# Patient Record
Sex: Male | Born: 1970 | Race: White | Hispanic: No | Marital: Single | Smoking: Former smoker
Health system: Southern US, Community
[De-identification: ages and names within clinical notes are randomized; demographics above are authoritative.]

## PROBLEM LIST (undated history)

## (undated) DIAGNOSIS — I2510 Atherosclerotic heart disease of native coronary artery without angina pectoris: Secondary | ICD-10-CM

## (undated) DIAGNOSIS — N200 Calculus of kidney: Secondary | ICD-10-CM

## (undated) DIAGNOSIS — E119 Type 2 diabetes mellitus without complications: Secondary | ICD-10-CM

## (undated) DIAGNOSIS — E785 Hyperlipidemia, unspecified: Secondary | ICD-10-CM

## (undated) DIAGNOSIS — J40 Bronchitis, not specified as acute or chronic: Secondary | ICD-10-CM

## (undated) DIAGNOSIS — I10 Essential (primary) hypertension: Secondary | ICD-10-CM

## (undated) HISTORY — DX: Hyperlipidemia, unspecified: E78.5

## (undated) HISTORY — DX: Calculus of kidney: N20.0

## (undated) HISTORY — DX: Type 2 diabetes mellitus without complications: E11.9

## (undated) HISTORY — DX: Atherosclerotic heart disease of native coronary artery without angina pectoris: I25.10

## (undated) HISTORY — DX: Hemochromatosis, unspecified: E83.119

---

## 2004-05-21 ENCOUNTER — Emergency Department: Payer: Self-pay | Admitting: General Practice

## 2004-05-23 ENCOUNTER — Ambulatory Visit: Payer: Self-pay | Admitting: General Practice

## 2004-05-23 IMAGING — NM NM MYOCARD GATED
6 series · 36 of 36 positions shown · non-contrast
Comparison: none

REASON FOR EXAM: Chest pain
COMMENTS:

PROCEDURE:     NM  - NM  GATED MYOVIEW   [DATE]  [DATE]
RESULT:
PROTOCOL: The patient underwent exercise treadmill utilizing Bruce protocol
achieving 85% of the maximum predicted heart rate.  The patient received
12.89 millicuries of Myoview with stress and 31.14 millicuries during delay
imaging.
RESULTS:  Gated imaging revealed an LV ejection fraction of 63%.  SPECT
analysis did not reveal any fixed or reversible defects.

[Series 1: gated stress myoview-gated (recon) · 6.6mm · 6.59mm/px · 6 of 264 frames shown]
[frame 23/264]
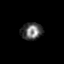
[frame 67/264]
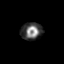
[frame 111/264]
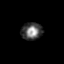
[frame 155/264]
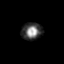
[frame 199/264]
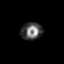
[frame 243/264]
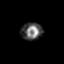

[Series 1: gated stress myoview-gated (corrected) · 6.59mm/px · 6 of 512 frames shown]
[frame 43/512  full-range]
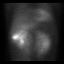
[frame 128/512  full-range]
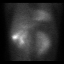
[frame 214/512  full-range]
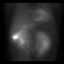
[frame 299/512  full-range]
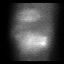
[frame 384/512  full-range]
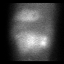
[frame 470/512  full-range]
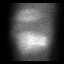

[Series 1: rest myoview (recon) · 6.6mm · 6.59mm/px · 6 of 36 frames shown]
[frame 4/36]
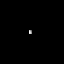
[frame 10/36]
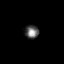
[frame 16/36]
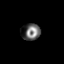
[frame 22/36]
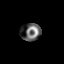
[frame 28/36]
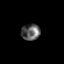
[frame 34/36]
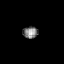

[Series 1: gated stress myoview · 6.59mm/px · 6 of 64 frames shown]
[frame 6/64]
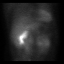
[frame 16/64]
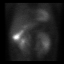
[frame 27/64]
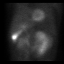
[frame 38/64]
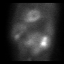
[frame 48/64]
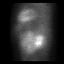
[frame 59/64]
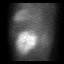

[Series 1: gated stress myoview (recon) · 6.6mm · 6.59mm/px · 6 of 39 frames shown]
[frame 4/39]
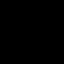
[frame 10/39]
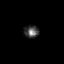
[frame 17/39]
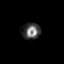
[frame 23/39]
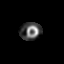
[frame 30/39]
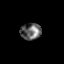
[frame 36/39]
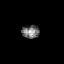

[Series 1: gated stress myoview-gated · 6.59mm/px · 6 of 512 frames shown]
[frame 43/512  full-range]
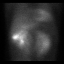
[frame 128/512  full-range]
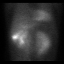
[frame 214/512  full-range]
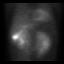
[frame 299/512  full-range]
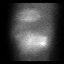
[frame 384/512  full-range]
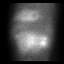
[frame 470/512  full-range]
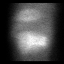

[36 of 36 positions shown; findings below may reference images not displayed]

IMPRESSION: Normal left ventricular function.

No evidence for scar or ischemia.

## 2004-07-17 ENCOUNTER — Emergency Department: Payer: Self-pay | Admitting: Emergency Medicine

## 2004-07-17 IMAGING — CT CT ABD-PELV W/O CM
1 of 2 series · 16 of 32 positions shown, 20 images · non-contrast
Comparison: none

REASON FOR EXAM: (1) abd pain poss stone pt in fasttrack; (2) abd pain
COMMENTS:

PROCEDURE:     CT  - CT ABDOMEN AND PELVIS W[DATE]  [DATE]
RESULT:       An emergent abdominal/pelvic CT is obtained for abdominal pain
into the LEFT lower quadrant.

[Series 2: stone · axial · 0.73mm/px · z∈[-185,+250]mm · 16 of 159 slices shown, 20 images]
[im 7/159  soft-tissue]
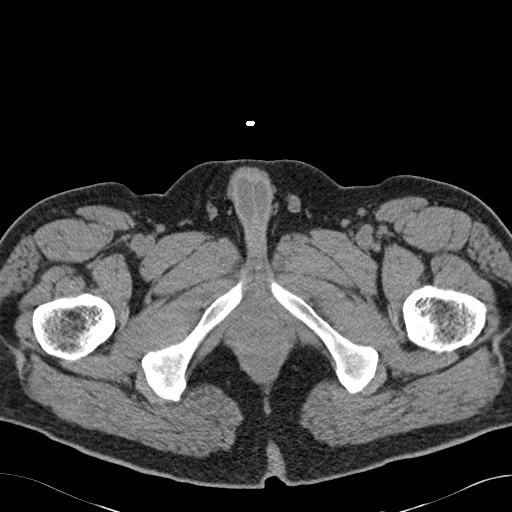
[im 7/159  bone]
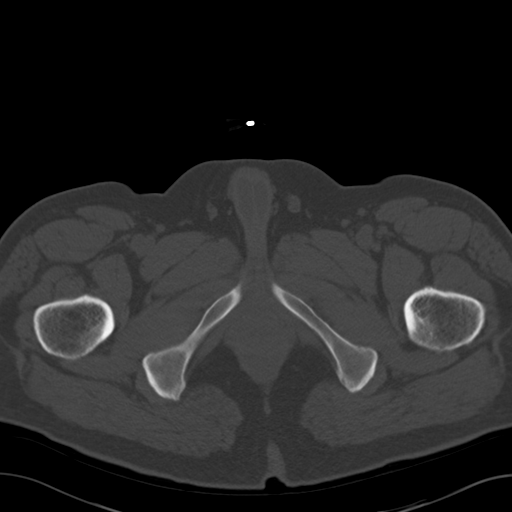
[im 20/159  soft-tissue]
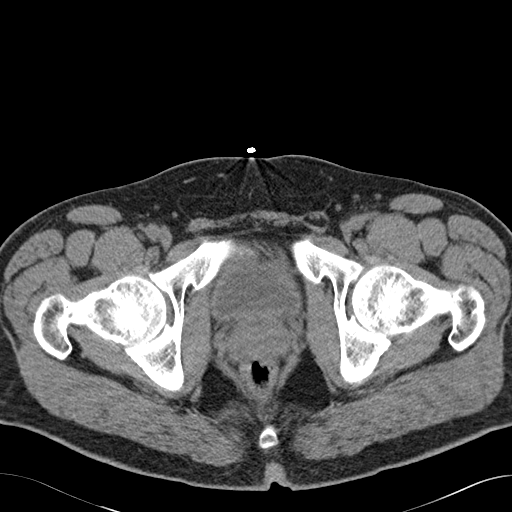
[im 33/159  soft-tissue]
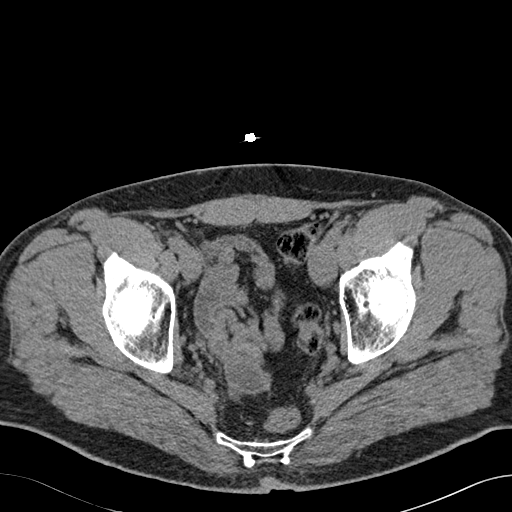
[im 40/159  soft-tissue]
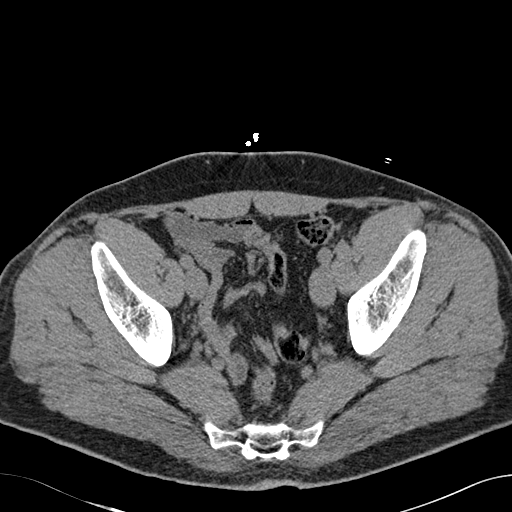
[im 53/159  soft-tissue]
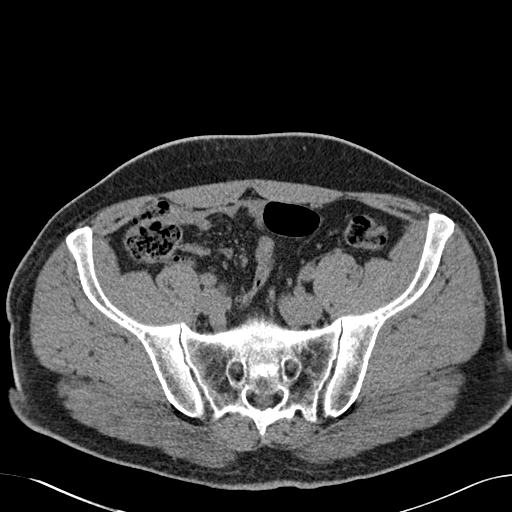
[im 66/159  soft-tissue]
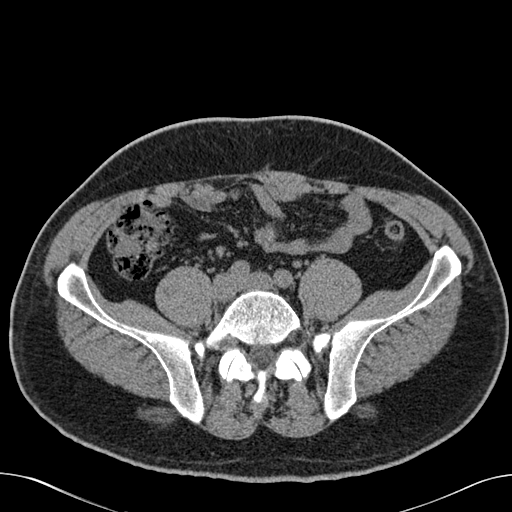
[im 73/159  soft-tissue]
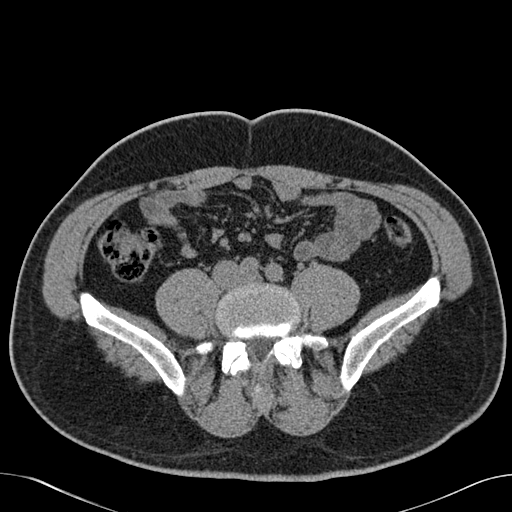
[im 86/159  soft-tissue]
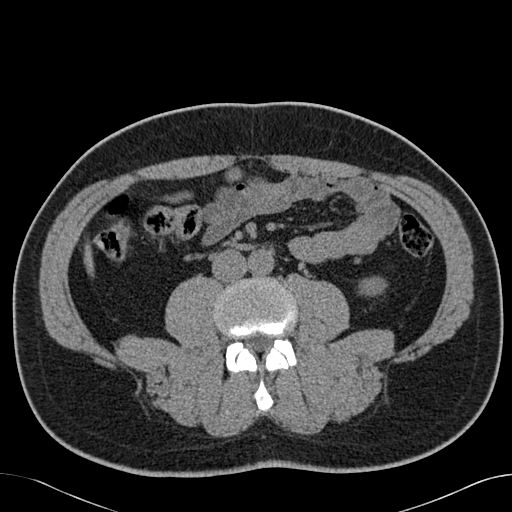
[im 93/159  soft-tissue]
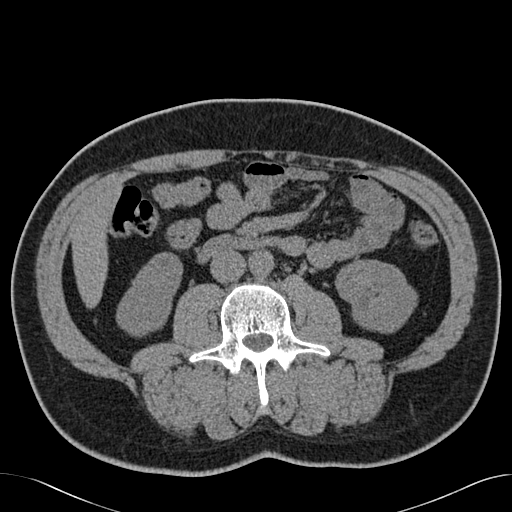
[im 93/159  bone]
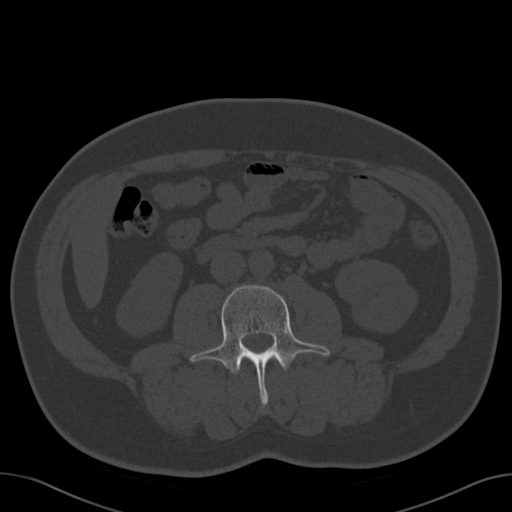
[im 106/159  soft-tissue]
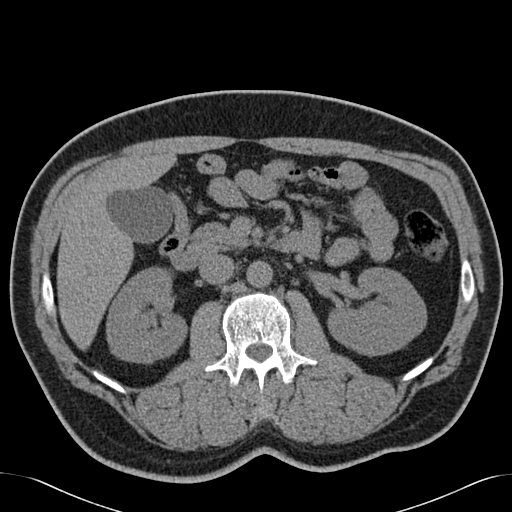
[im 119/159  soft-tissue]
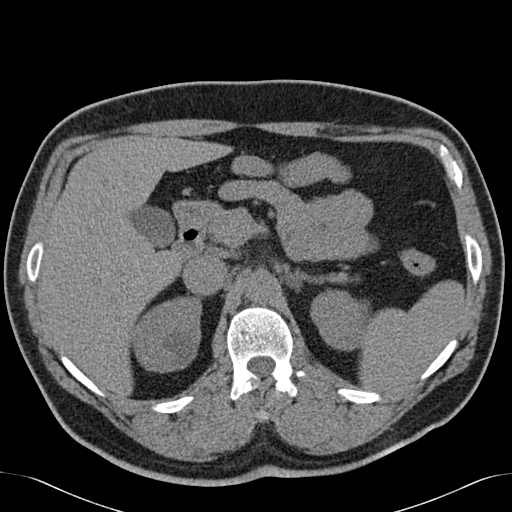
[im 126/159  soft-tissue]
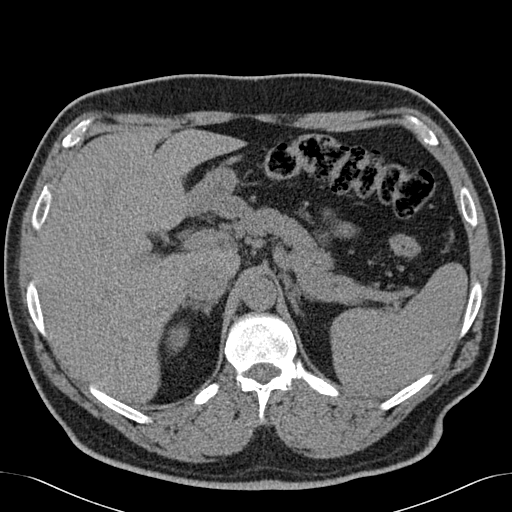
[im 132/159  lung]
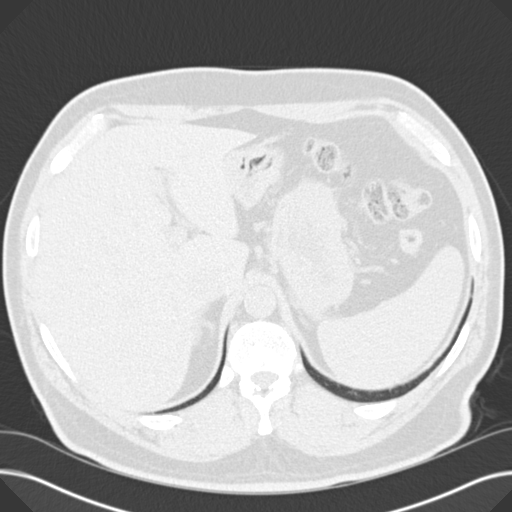
[im 139/159  soft-tissue]
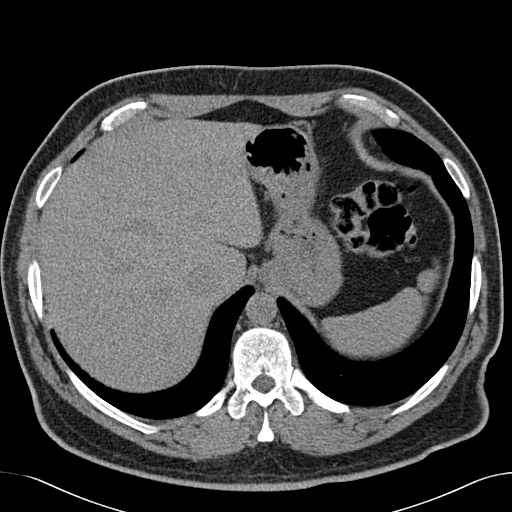
[im 139/159  lung]
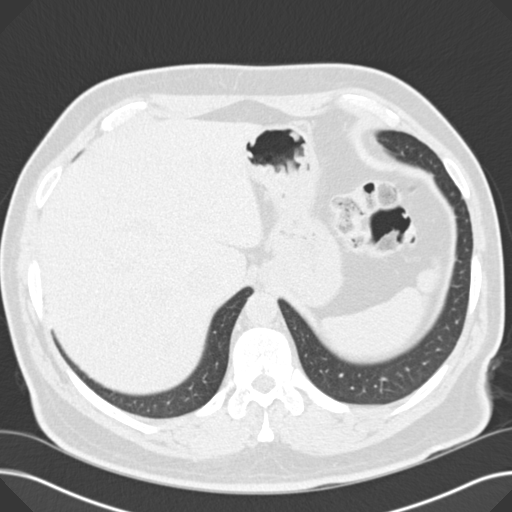
[im 145/159  lung]
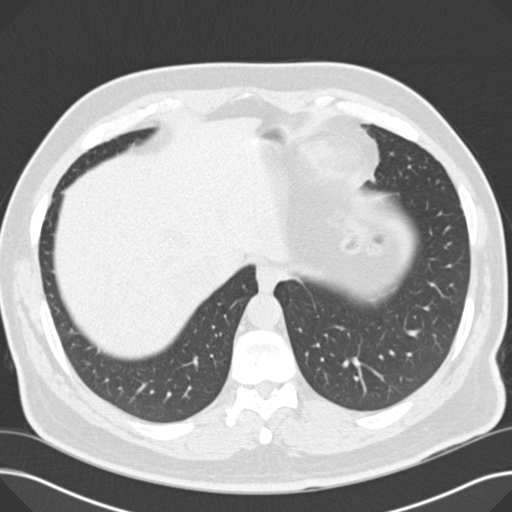
[im 152/159  soft-tissue]
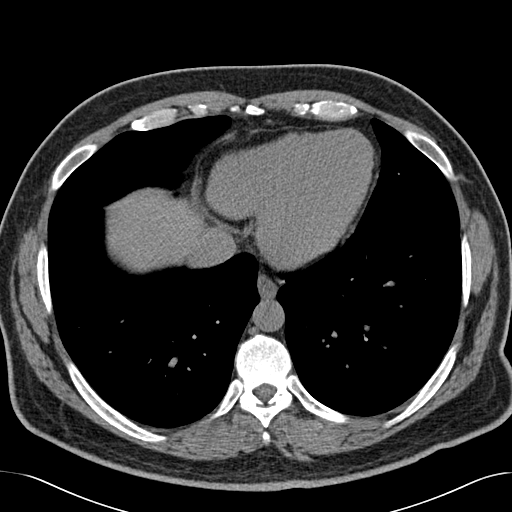
[im 152/159  lung]
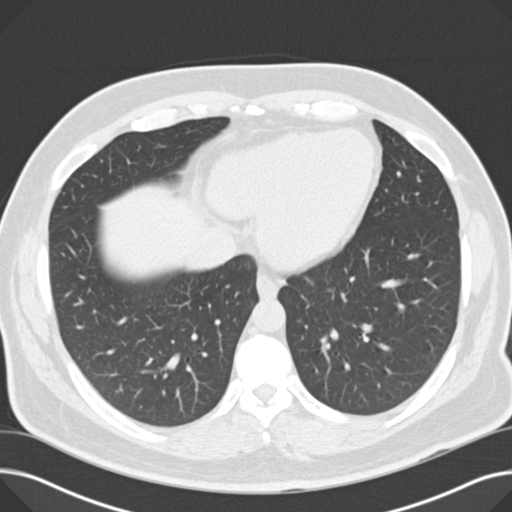

[16 of 32 positions shown; findings below may reference images not displayed]

FINDINGS: On these unenhanced images, no renal calculi are identified.
No hydronephrosis is seen.

No definite calculi are noted within the ureters.  No dirtiness to the fat
is noted to suggest inflammatory process.
IMPRESSION: No renal or ureteral calculi are identified.

## 2004-11-06 ENCOUNTER — Emergency Department: Payer: Self-pay | Admitting: Emergency Medicine

## 2004-11-06 IMAGING — CR DG RIBS 2V*L*
1 series · 5 of 5 positions shown · non-contrast
Comparison: none

REASON FOR EXAM: pain
COMMENTS:

PROCEDURE:     DXR - DXR RIBS LEFT UNILATERAL  - [DATE] [DATE]
RESULT:       A lucency is appreciated along the anterolateral portion of
the LEFT ribs consistent with a nondisplaced fracture.  No further fractures
or dislocations are appreciated.

[Series 470: antero_posterior · 0.22mm/px · 5 of 5 slices shown]
[im 1/5]
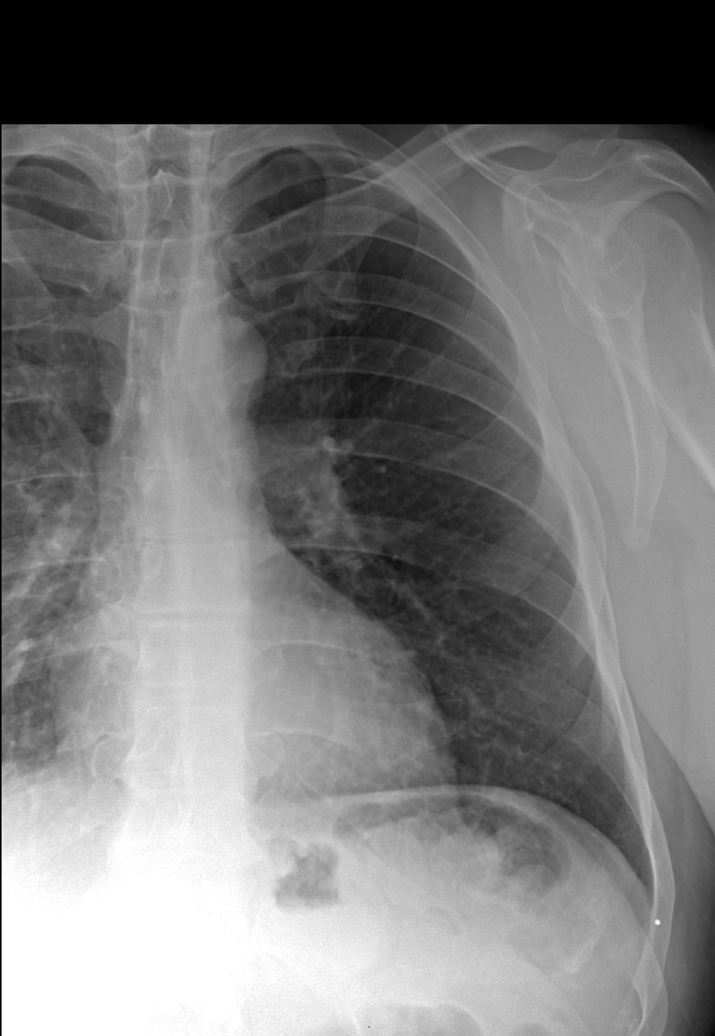
[im 2/5]
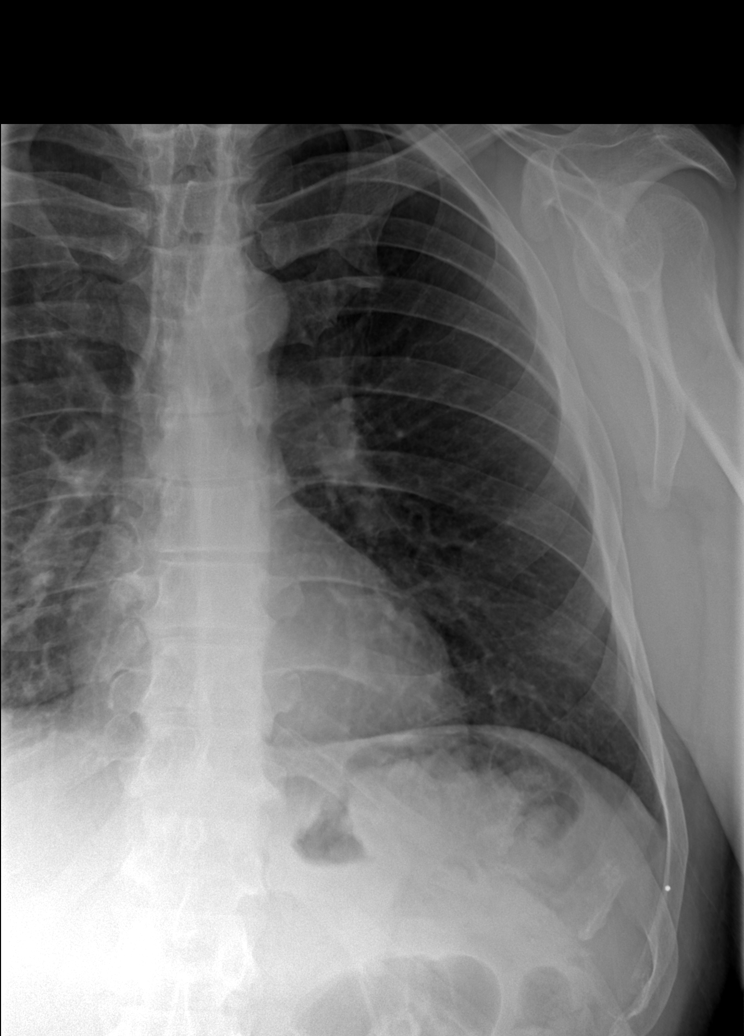
[im 3/5]
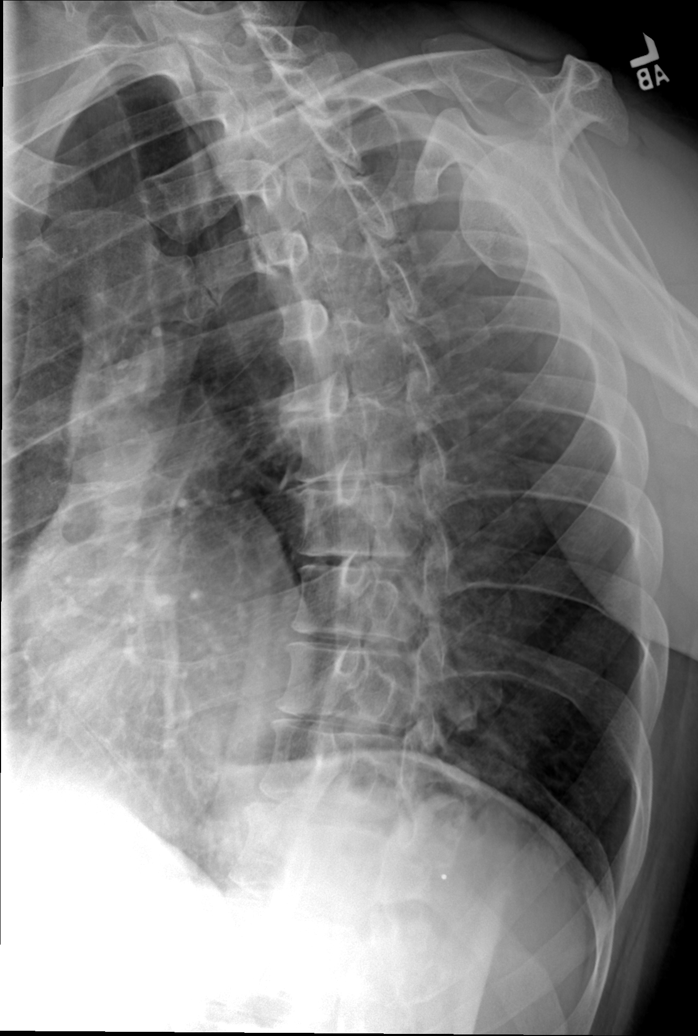
[im 4/5]
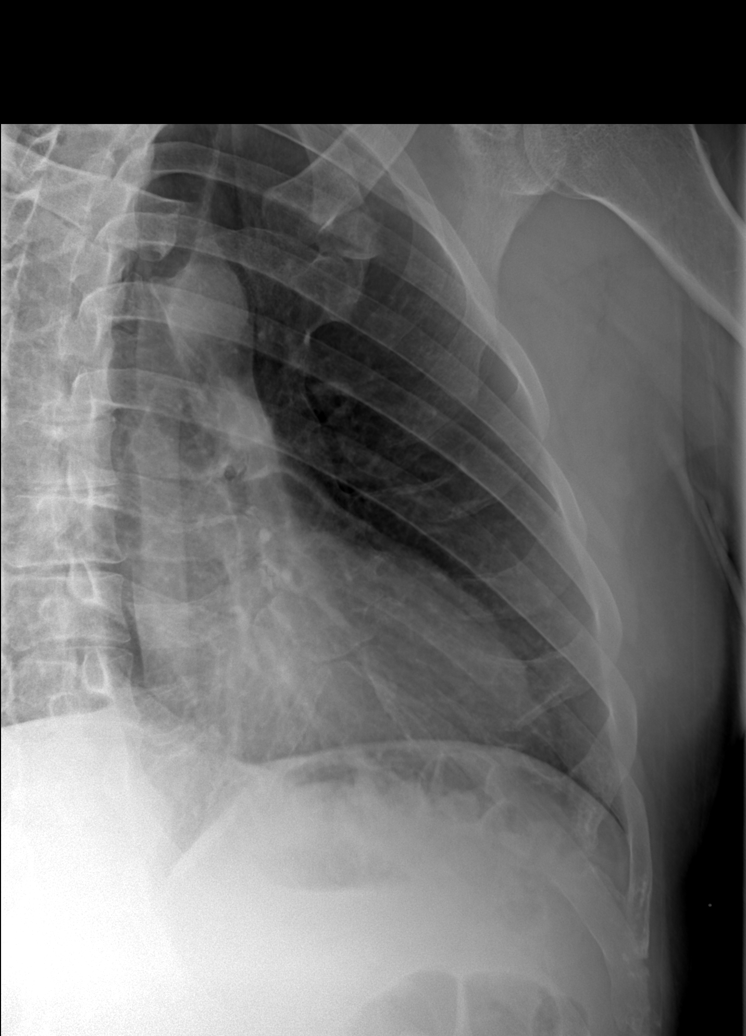
[im 5/5]
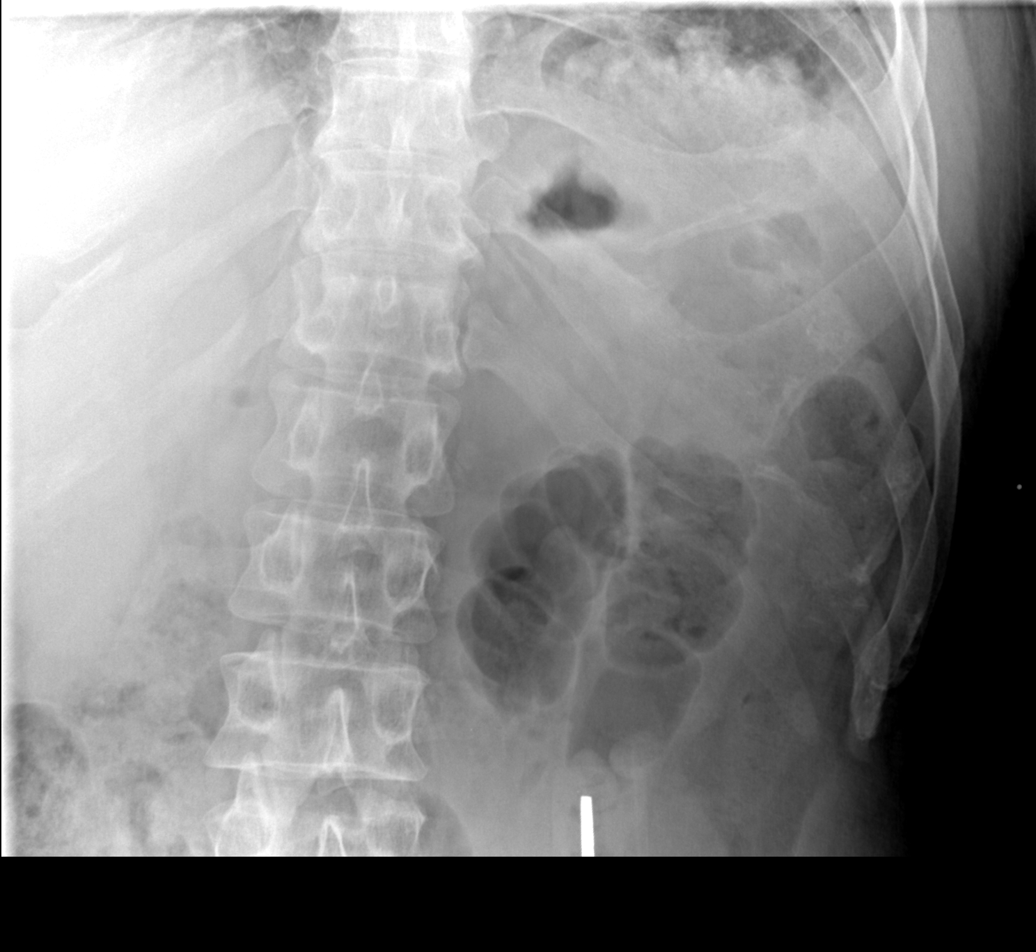

[5 of 5 positions shown; findings below may reference images not displayed]

IMPRESSION: See above.

## 2004-11-28 ENCOUNTER — Emergency Department: Payer: Self-pay | Admitting: Emergency Medicine

## 2005-04-27 ENCOUNTER — Other Ambulatory Visit: Payer: Self-pay

## 2005-04-27 ENCOUNTER — Emergency Department: Payer: Self-pay | Admitting: Emergency Medicine

## 2005-11-26 ENCOUNTER — Emergency Department: Payer: Self-pay | Admitting: Internal Medicine

## 2008-10-11 ENCOUNTER — Ambulatory Visit: Payer: Self-pay | Admitting: Cardiology

## 2016-07-23 DIAGNOSIS — I159 Secondary hypertension, unspecified: Secondary | ICD-10-CM | POA: Insufficient documentation

## 2017-07-20 DIAGNOSIS — M25562 Pain in left knee: Secondary | ICD-10-CM | POA: Diagnosis not present

## 2017-08-10 ENCOUNTER — Ambulatory Visit: Payer: Self-pay | Attending: Orthopaedic Surgery | Admitting: Physical Therapy

## 2017-08-10 ENCOUNTER — Encounter: Payer: Self-pay | Admitting: Physical Therapy

## 2017-08-10 DIAGNOSIS — M25562 Pain in left knee: Secondary | ICD-10-CM | POA: Insufficient documentation

## 2017-08-10 DIAGNOSIS — M25662 Stiffness of left knee, not elsewhere classified: Secondary | ICD-10-CM | POA: Insufficient documentation

## 2017-08-10 DIAGNOSIS — R600 Localized edema: Secondary | ICD-10-CM | POA: Insufficient documentation

## 2017-08-10 NOTE — Therapy (Signed)
[facility] [address] [place], [place], [address] Phone: [phone]   Fax:  [phone]  Physical Therapy Evaluation  Patient Details  Name: Curtis Marshall MRN: [id number] Date of Birth: 31-Jul-1970 Referring Provider: Linus Salmons MD   Encounter Date: 08/10/2017  PT End of Session - 08/10/17 1352    Visit Number  1    Number of Visits  15    Date for PT Re-Evaluation  10/05/17    PT Start Time  0138    PT Stop Time  0204    PT Time Calculation (min)  26 min    Behavior During Therapy  [facility] for tasks assessed/performed       History reviewed. No pertinent past medical history.  History reviewed. No pertinent surgical history.  There were no vitals filed for this visit.   Subjective Assessment - 08/10/17 1359    Subjective  The patient reports about a month a go he was coming off the last step from his home when his left knee went backward.  It produced severe pain.  He states he thought it would get better but after about 4 days the pain was intolerable.  He reports a pain-level of 8/10 today.  He reports his knee "pops" and feels like it could give way.  Being up increases paina nd rest decreases pain.    Limitations  Walking    How long can you walk comfortably?  Can walk but left knee hurts and swells ago.    Patient Stated Goals  Get out of pain.    Currently in Pain?  Yes    Pain Score  8     Pain Location  Knee    Pain Orientation  Left    Pain Descriptors / Indicators  Aching;Throbbing;Sharp    Pain Type  Acute pain    Pain Onset  1 to 4 weeks ago    Aggravating Factors   See above.    Pain Relieving Factors  See above.         [facility] PT Assessment - 08/10/17 0001      Assessment   Medical Diagnosis  Left knee pain.    Referring Provider  Linus Salmons MD    Onset Date/Surgical Date  -- ~ one month.      Precautions   Precautions  None      Restrictions   Weight Bearing Restrictions  No      Balance  Screen   Has the patient fallen in the past 6 months  No    Has the patient had a decrease in activity level because of a fear of falling?   No    Is the patient reluctant to leave their home because of a fear of falling?   No      Home Public house manager residence      Prior Function   Level of Independence  Independent      ROM / Strength   AROM / PROM / Strength  AROM;Strength      AROM   Overall AROM Comments  Full left knee extension and flexion limited to 90 degrees most likely related to pain.      Strength   Overall Strength Comments  Left hip strength= 5/5 and left knee extension= 5/5.      Palpation   Palpation comment  Very tender to palpation over left medial joint line.  Special Tests   Other special tests  (-)Anterior Drawer test; increased laxity with left knee valgum test when contralaterally compared.  Circumferential measurement at mid-patellar region is 5 cms greater on left than right.      Ambulation/Gait   Gait Comments  Very antalgic gait pattern.  Patient in obvious pain.                Objective measurements completed on examination: See above findings.                   PT Long Term Goals - 08/10/17 1418      PT LONG TERM GOAL #1   Title  Independent with a HEP.    Baseline  No knowledge of appropriate ther ex.    Time  8    Period  Weeks    Status  New      PT LONG TERM GOAL #2   Title  Active left knee flexion= 120 degrees.    Baseline  90 degrees of left knee flexion.    Time  8    Period  Weeks    Status  New      PT LONG TERM GOAL #3   Title  Walk a community distance with no pain and a normal gait cycle.    Baseline  Patient's gait is very antalgic in nature.    Time  8    Period  Weeks    Status  New      PT LONG TERM GOAL #4   Title  Reduce edema to within 2 cms of non-affected side.    Baseline  Currently a 5 cms difference.    Time  8    Period  Weeks    Status  New              Plan - 08/10/17 1413    Clinical Impression Statement  The patient injured his left knee coming off a step when his knee went backward.  His left knee flexion is limited at least in part due to pain.  He has a significant amount of left knee swelling and is very tender over his left knee medial joint line.  His pain and deficits have impaired his finctional mobility.  His gait pattern is very antalgic in nature.  Patient states his MD told him he likely has torn cartilage.  The patient will benefit from skilled physical therapy intervention.    Clinical Presentation  Evolving    Clinical Presentation due to:  Not improving.    Clinical Decision Making  Low    Rehab Potential  Good    PT Frequency  -- 3 visits first month per authorization period f/b 2 times a week for 6 weeks.    PT Treatment/Interventions  ADLs/Self Care Home Management;Cryotherapy;Retail banker;Therapeutic activities;Therapeutic exercise;Neuromuscular re-education;Patient/family education;Passive range of motion;Manual techniques;Vasopneumatic Device    PT Next Visit Plan  Nustep and left knee PROM; pain-free left quadriceps strengthening.  E'stim and vasopnuematic.    Consulted and Agree with Plan of Care  Patient       Patient will benefit from skilled therapeutic intervention in order to improve the following deficits and impairments:  Abnormal gait, Decreased activity tolerance, Decreased mobility, Decreased range of motion, Increased edema, Pain  Visit Diagnosis: Acute pain of left knee  Stiffness of left knee, not elsewhere classified  Localized edema     Problem List There are no active problems to display for  this patient.   Zaelynn Fuchs, [place] MPT 08/10/2017, 2:22 PM  [facility] [address] [place], [place], [address] Phone: [phone]   Fax:  [phone]  Name: Curtis Marshall MRN: [id number] Date of  Birth: January 20, 1971

## 2019-03-04 ENCOUNTER — Other Ambulatory Visit: Payer: Self-pay

## 2019-03-04 DIAGNOSIS — Z20822 Contact with and (suspected) exposure to covid-19: Secondary | ICD-10-CM

## 2019-03-04 DIAGNOSIS — Z20828 Contact with and (suspected) exposure to other viral communicable diseases: Secondary | ICD-10-CM | POA: Diagnosis not present

## 2019-03-05 LAB — NOVEL CORONAVIRUS, NAA: SARS-CoV-2, NAA: NOT DETECTED

## 2019-04-22 ENCOUNTER — Other Ambulatory Visit: Payer: Self-pay

## 2019-04-22 DIAGNOSIS — Z20822 Contact with and (suspected) exposure to covid-19: Secondary | ICD-10-CM

## 2019-04-23 LAB — NOVEL CORONAVIRUS, NAA: SARS-CoV-2, NAA: DETECTED — AB

## 2019-05-11 ENCOUNTER — Ambulatory Visit: Payer: Medicaid Other | Attending: Internal Medicine

## 2020-01-22 ENCOUNTER — Encounter (HOSPITAL_BASED_OUTPATIENT_CLINIC_OR_DEPARTMENT_OTHER): Payer: Self-pay | Admitting: Emergency Medicine

## 2020-01-22 ENCOUNTER — Other Ambulatory Visit: Payer: Self-pay

## 2020-01-22 ENCOUNTER — Emergency Department (HOSPITAL_BASED_OUTPATIENT_CLINIC_OR_DEPARTMENT_OTHER)
Admission: EM | Admit: 2020-01-22 | Discharge: 2020-01-23 | Disposition: A | Discharge status: Home / Self Care | Payer: Medicaid Other | Attending: Emergency Medicine | Admitting: Emergency Medicine

## 2020-01-22 ENCOUNTER — Emergency Department (HOSPITAL_BASED_OUTPATIENT_CLINIC_OR_DEPARTMENT_OTHER): Payer: Medicaid Other

## 2020-01-22 DIAGNOSIS — R0789 Other chest pain: Secondary | ICD-10-CM | POA: Insufficient documentation

## 2020-01-22 DIAGNOSIS — J209 Acute bronchitis, unspecified: Secondary | ICD-10-CM | POA: Insufficient documentation

## 2020-01-22 DIAGNOSIS — E669 Obesity, unspecified: Secondary | ICD-10-CM | POA: Insufficient documentation

## 2020-01-22 DIAGNOSIS — I10 Essential (primary) hypertension: Secondary | ICD-10-CM | POA: Insufficient documentation

## 2020-01-22 DIAGNOSIS — F17200 Nicotine dependence, unspecified, uncomplicated: Secondary | ICD-10-CM | POA: Insufficient documentation

## 2020-01-22 HISTORY — DX: Essential (primary) hypertension: I10

## 2020-01-22 IMAGING — DX DG CHEST 2V
2 series · 2 of 2 positions shown · non-contrast
Comparison: None.

CLINICAL DATA: Cough.  Rib cage pain

EXAM:
CHEST - 2 VIEW

[chest pa]
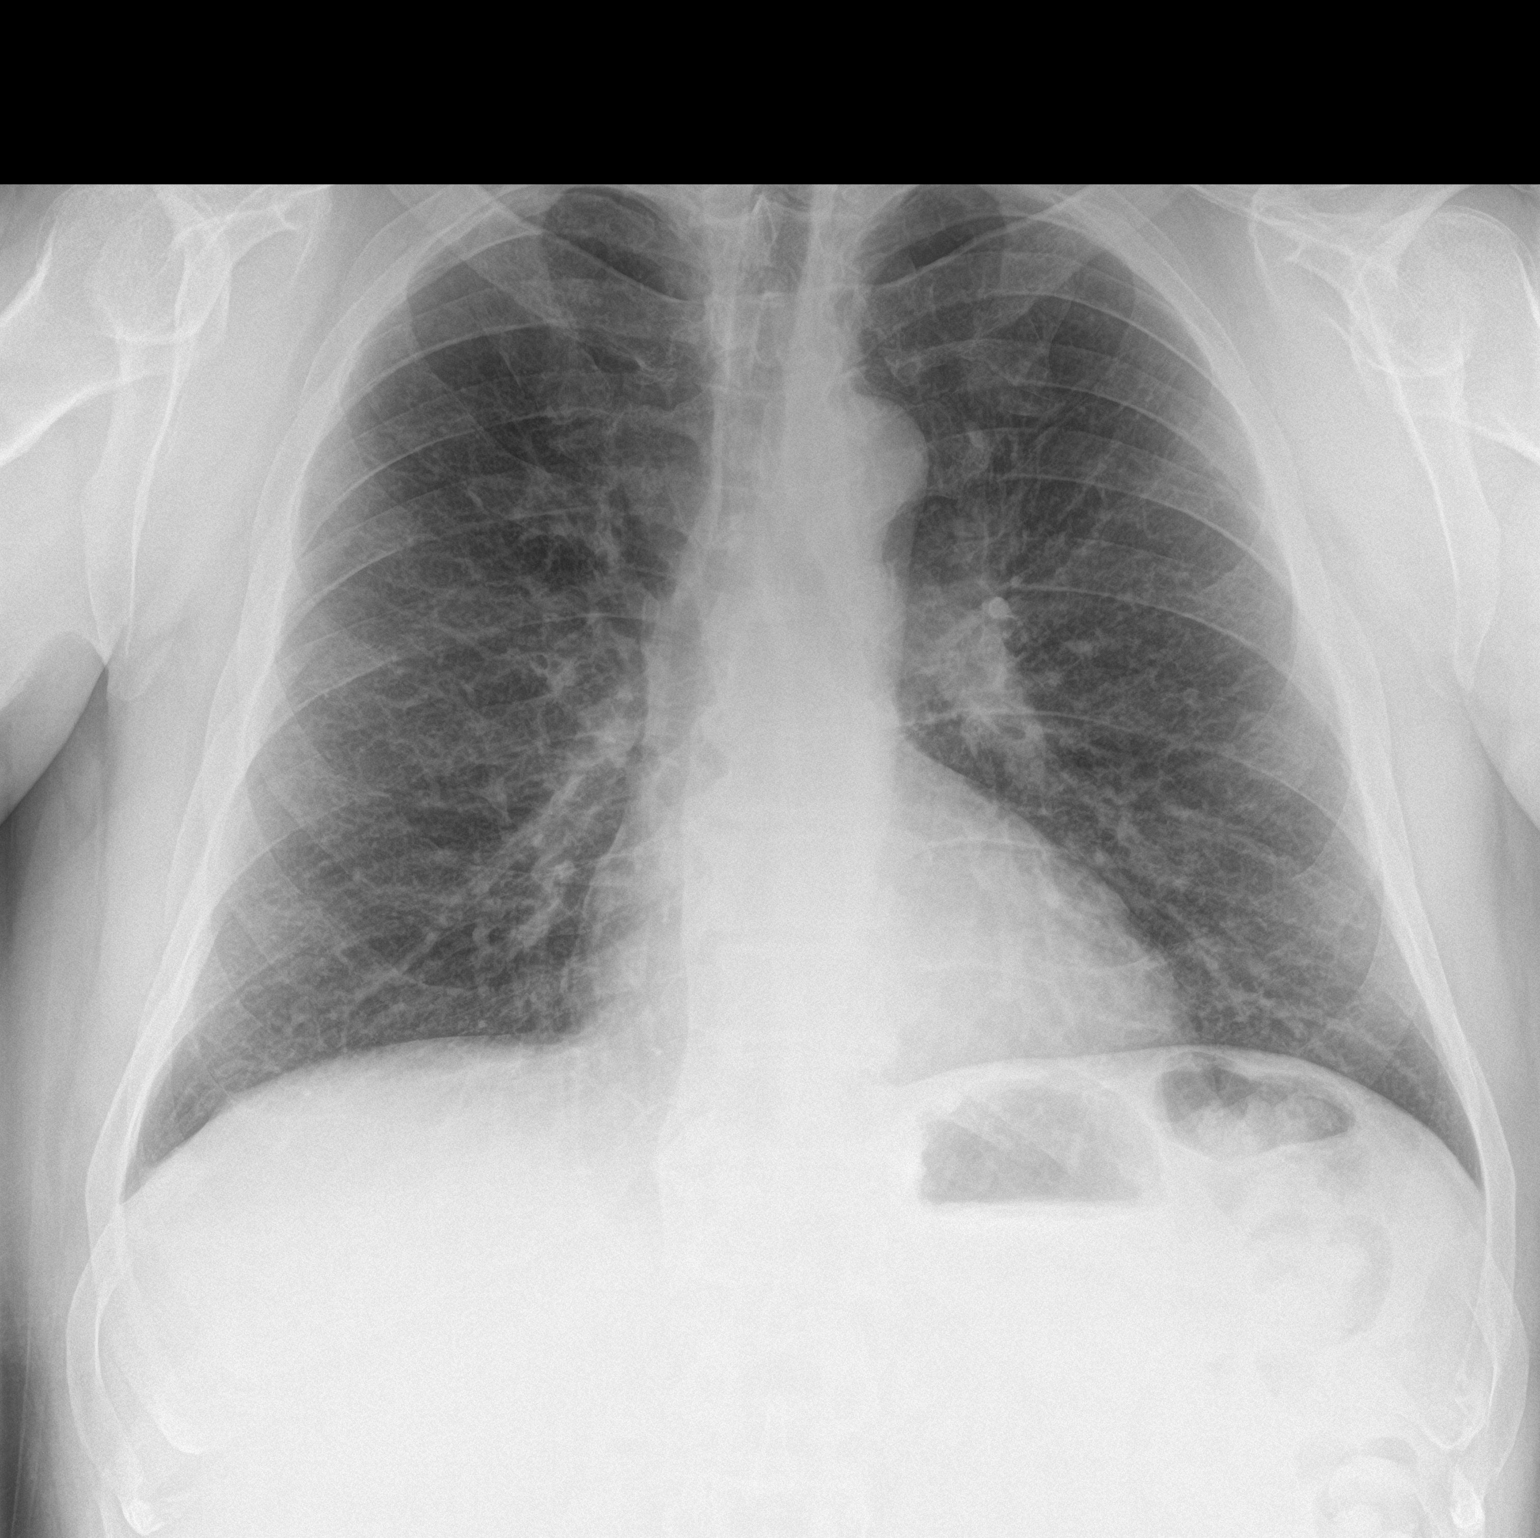

[chest lat]
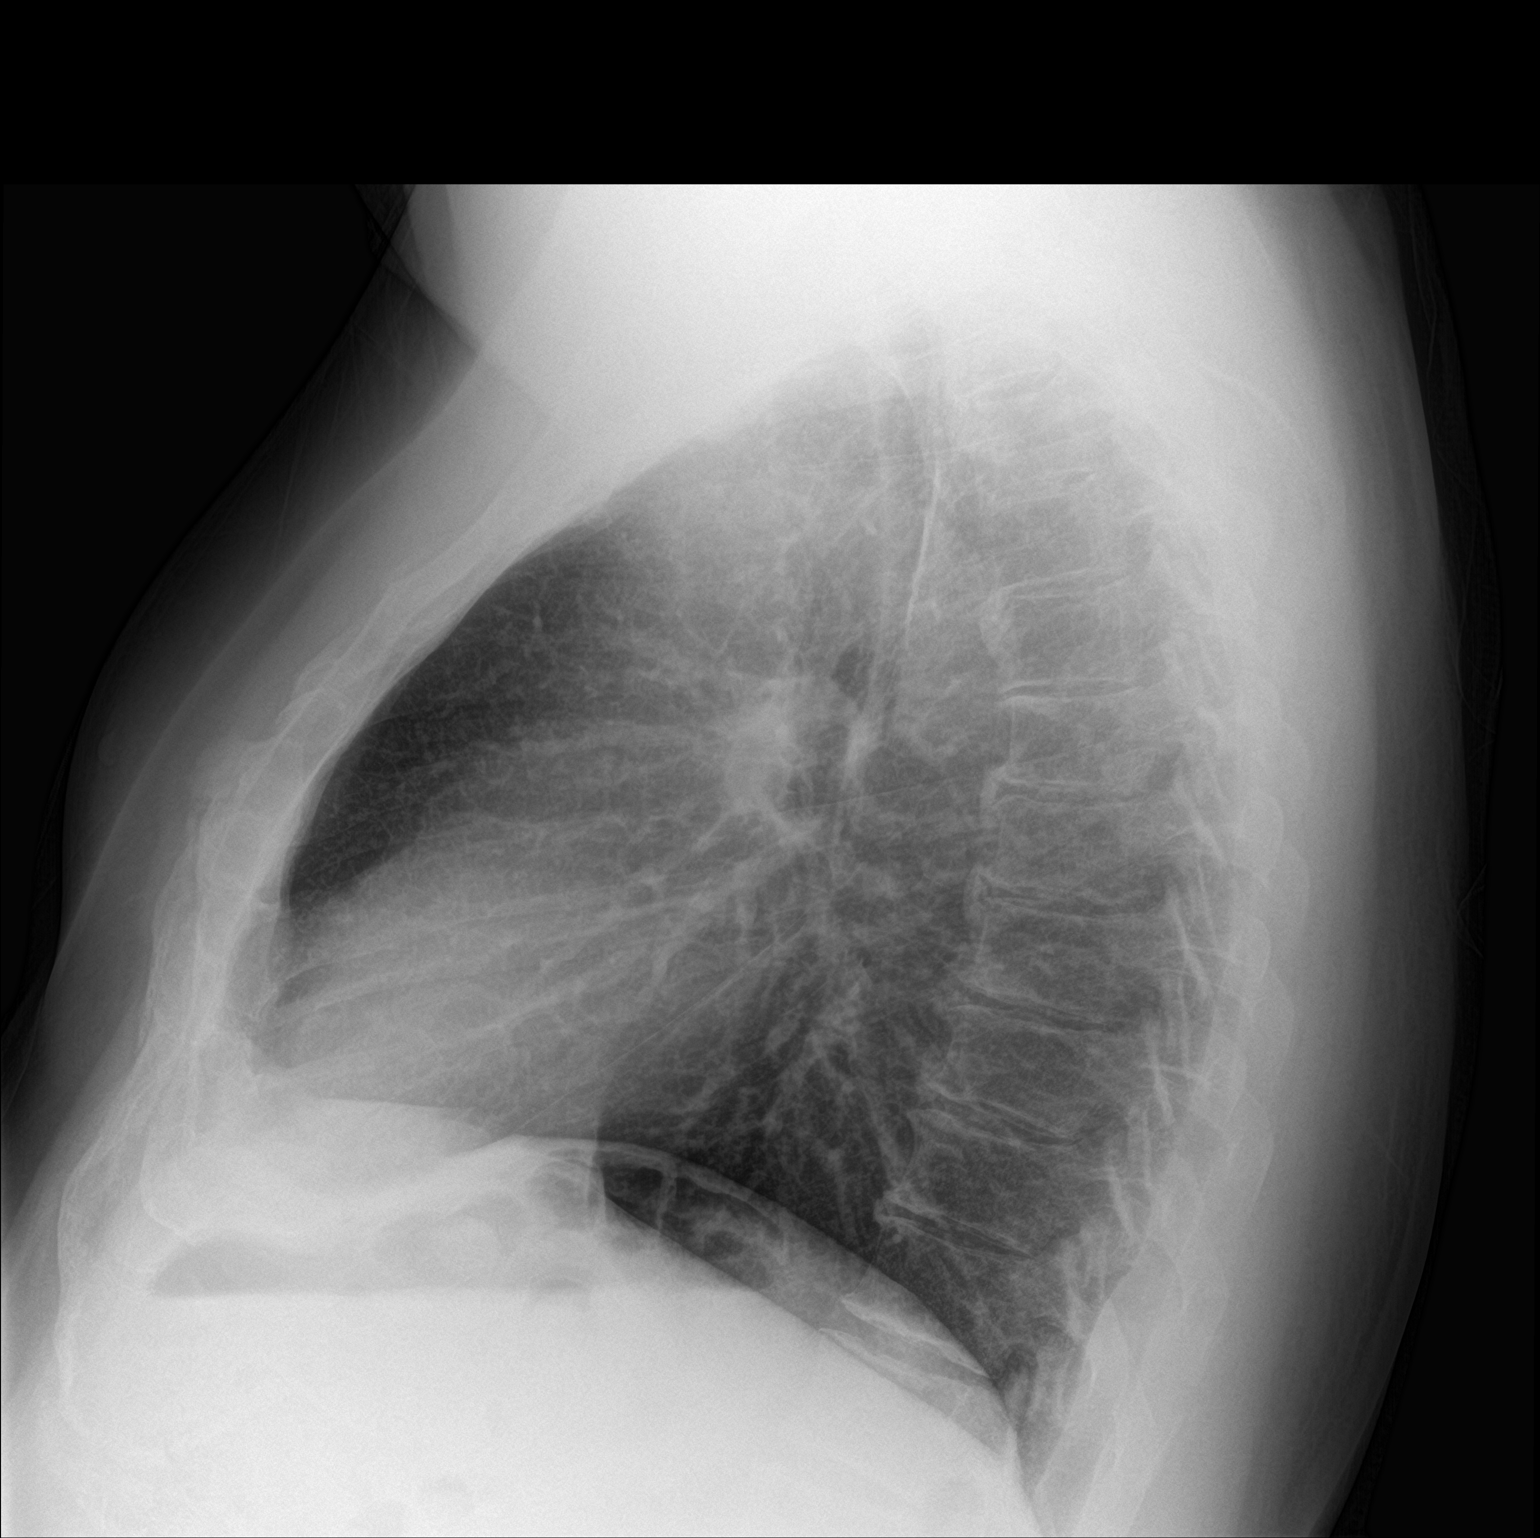

[2 of 2 positions shown; findings below may reference images not displayed]

FINDINGS: The heart size and mediastinal contours are within normal limits.
Both lungs are clear. The visualized skeletal structures are
unremarkable.
IMPRESSION: No active cardiopulmonary disease.

## 2020-01-22 NOTE — ED Triage Notes (Signed)
Reports pain in ribcage from coughing that feels like when he had the "walking pneumonia".  Endorses productive cough with green sputum.  Symptoms started this morning.  Denies having any fever.

## 2020-01-23 ENCOUNTER — Other Ambulatory Visit: Payer: Self-pay

## 2020-01-23 MED ORDER — ALBUTEROL SULFATE HFA 108 (90 BASE) MCG/ACT IN AERS
2.0000 | INHALATION_SPRAY | Freq: Once | RESPIRATORY_TRACT | Status: AC
  Administered 2020-01-23: 2 via RESPIRATORY_TRACT
  Filled 2020-01-23: qty 6.7

## 2020-01-23 MED ORDER — IBUPROFEN 400 MG PO TABS
600.0000 mg | ORAL_TABLET | Freq: Once | ORAL | Status: AC
  Administered 2020-01-23: 600 mg via ORAL
  Filled 2020-01-23: qty 1

## 2020-01-23 MED ORDER — DEXAMETHASONE SODIUM PHOSPHATE 10 MG/ML IJ SOLN
10.0000 mg | Freq: Once | INTRAMUSCULAR | Status: AC
Start: 2020-01-23 — End: 2020-01-23
  Administered 2020-01-23: 10 mg via INTRAMUSCULAR
  Filled 2020-01-23: qty 1

## 2020-01-23 NOTE — ED Provider Notes (Signed)
[facility] EMERGENCY DEPARTMENT Provider Note   CSN: [id number] Arrival date & time: 01/22/20  2017     History Chief Complaint  Patient presents with  . Cough    Curtis Marshall is a 49 y.o. male.  HPI     This 49 year old male with a history of hypertension and smoking who presents with cough and chest discomfort.  Patient reports 1 day history of progressively worsening cough.  He states that it is occasionally productive.  No fevers.  He states that he has developed some right-sided chest discomfort only with coughing.  It is sharp and nonradiating.  Rates his pain 8 out of 10.  Has not taken anything for the pain.  No exertional symptoms.  No known sick contacts or Covid exposures.  He is fully vaccinated against COVID-19.  Denies shortness of breath.  No history of asthma.  Past Medical History:  Diagnosis Date  . Hypertension     There are no problems to display for this patient.   History reviewed. No pertinent surgical history.     No family history on file.  Social History   Tobacco Use  . Smoking status: Current Every Day Smoker  . Smokeless tobacco: Never Used  Substance Use Topics  . Alcohol use: Never  . Drug use: Never    Home Medications Prior to Admission medications   Not on File    Allergies    Patient has no allergy information on record.  Review of Systems   Review of Systems  Constitutional: Negative for fever.  Respiratory: Positive for cough. Negative for shortness of breath.   Cardiovascular: Positive for chest pain. Negative for leg swelling.  Gastrointestinal: Negative for abdominal pain, nausea and vomiting.  All other systems reviewed and are negative.   Physical Exam Updated Vital Signs BP (!) 136/94   Pulse 69   Temp 98.5 F (36.9 C) (Oral)   Resp 20   Ht 1.905 m (6\' 3" )   Wt 126.1 kg   SpO2 96%   BMI 34.75 kg/m   Physical Exam Vitals and nursing note reviewed.  Constitutional:      Appearance:  He is well-developed. He is obese. He is not ill-appearing.  HENT:     Head: Normocephalic and atraumatic.     Mouth/Throat:     Mouth: Mucous membranes are moist.  Eyes:     Pupils: Pupils are equal, round, and reactive to light.  Cardiovascular:     Rate and Rhythm: Normal rate and regular rhythm.     Heart sounds: Normal heart sounds. No murmur heard.   Pulmonary:     Effort: Pulmonary effort is normal. No respiratory distress.     Breath sounds: Wheezing present.     Comments: Occasional wheeze, right-sided chest wall tenderness palpation, no crepitus or overlying skin changes Chest:     Chest wall: Tenderness present.  Abdominal:     General: Bowel sounds are normal.     Palpations: Abdomen is soft.     Tenderness: There is no abdominal tenderness. There is no rebound.  Musculoskeletal:        General: No tenderness.     Cervical back: Neck supple.     Right lower leg: No edema.     Left lower leg: No edema.  Lymphadenopathy:     Cervical: No cervical adenopathy.  Skin:    General: Skin is warm and dry.  Neurological:     Mental Status: He is alert  and oriented to person, place, and time.  Psychiatric:        Mood and Affect: Mood normal.     ED Results / Procedures / Treatments   Labs (all labs ordered are listed, but only abnormal results are displayed) Labs Reviewed - No data to display  EKG None  Radiology DG Chest 2 View  Result Date: 01/22/2020 CLINICAL DATA:  Cough.  Rib cage pain EXAM: CHEST - 2 VIEW COMPARISON:  None. FINDINGS: The heart size and mediastinal contours are within normal limits. Both lungs are clear. The visualized skeletal structures are unremarkable. IMPRESSION: No active cardiopulmonary disease. Electronically Signed   By: Katherine Mantle M.D.   On: 01/22/2020 21:29    Procedures Procedures (including critical care time)  Medications Ordered in ED Medications  dexamethasone (DECADRON) injection 10 mg (10 mg Intramuscular Given  01/23/20 0052)  albuterol (VENTOLIN HFA) 108 (90 Base) MCG/ACT inhaler 2 puff (2 puffs Inhalation Given 01/23/20 0051)  ibuprofen (ADVIL) tablet 600 mg (600 mg Oral Given 01/23/20 0052)    ED Course  I have reviewed the triage vital signs and the nursing notes.  Pertinent labs & imaging results that were available during my care of the patient were reviewed by me and considered in my medical decision making (see chart for details).    MDM Rules/Calculators/A&P                           Patient presents with cough and chest discomfort.  He is overall nontoxic and vital signs are reassuring.  He is not hypoxic or febrile.  He does have an occasional wheeze and is a non-smoker.  Chest pain is atypical and reproducible.  Doubt ACS or PE.  Chest x-ray obtained and shows no evidence of pneumothorax or pneumonia.  EKG without acute ischemic changes.  Patient was given a dose of IM Decadron and an inhaler for presumed bronchitis.  Recommend supportive measures.  After history, exam, and medical workup I feel the patient has been appropriately medically screened and is safe for discharge home. Pertinent diagnoses were discussed with the patient. Patient was given return precautions.   Final Clinical Impression(s) / ED Diagnoses Final diagnoses:  Acute bronchitis, unspecified organism    Rx / DC Orders ED Discharge Orders    None       Ahilyn Nell, Mayer Masker, MD 01/23/20 0106

## 2020-01-23 NOTE — Discharge Instructions (Addendum)
You were seen today for cough and chest pain.  You likely have bronchitis.  It is very important that you attempt to discontinue smoking.  Use inhaler as needed.  Take ibuprofen as needed for any pain or discomfort.  If you develop fevers or worsening symptoms, you should be reevaluated.

## 2020-01-25 DIAGNOSIS — J189 Pneumonia, unspecified organism: Secondary | ICD-10-CM | POA: Diagnosis not present

## 2020-01-25 DIAGNOSIS — R05 Cough: Secondary | ICD-10-CM | POA: Diagnosis not present

## 2020-01-25 DIAGNOSIS — Z03818 Encounter for observation for suspected exposure to other biological agents ruled out: Secondary | ICD-10-CM | POA: Diagnosis not present

## 2020-05-22 DIAGNOSIS — R509 Fever, unspecified: Secondary | ICD-10-CM | POA: Diagnosis not present

## 2020-09-13 DIAGNOSIS — M76892 Other specified enthesopathies of left lower limb, excluding foot: Secondary | ICD-10-CM | POA: Diagnosis not present

## 2020-09-13 DIAGNOSIS — M1712 Unilateral primary osteoarthritis, left knee: Secondary | ICD-10-CM | POA: Diagnosis not present

## 2020-09-13 DIAGNOSIS — M2352 Chronic instability of knee, left knee: Secondary | ICD-10-CM | POA: Diagnosis not present

## 2020-09-13 DIAGNOSIS — Z9889 Other specified postprocedural states: Secondary | ICD-10-CM | POA: Diagnosis not present

## 2020-09-13 DIAGNOSIS — M25562 Pain in left knee: Secondary | ICD-10-CM | POA: Diagnosis not present

## 2020-09-13 DIAGNOSIS — I8392 Asymptomatic varicose veins of left lower extremity: Secondary | ICD-10-CM | POA: Diagnosis not present

## 2020-10-05 DIAGNOSIS — S86912A Strain of unspecified muscle(s) and tendon(s) at lower leg level, left leg, initial encounter: Secondary | ICD-10-CM | POA: Diagnosis not present

## 2020-10-05 DIAGNOSIS — M94262 Chondromalacia, left knee: Secondary | ICD-10-CM | POA: Diagnosis not present

## 2020-10-05 DIAGNOSIS — M25562 Pain in left knee: Secondary | ICD-10-CM | POA: Diagnosis not present

## 2021-01-04 DIAGNOSIS — R238 Other skin changes: Secondary | ICD-10-CM | POA: Diagnosis not present

## 2021-01-04 DIAGNOSIS — Z79899 Other long term (current) drug therapy: Secondary | ICD-10-CM | POA: Diagnosis not present

## 2021-01-04 DIAGNOSIS — I10 Essential (primary) hypertension: Secondary | ICD-10-CM | POA: Diagnosis not present

## 2021-01-04 DIAGNOSIS — W548XXA Other contact with dog, initial encounter: Secondary | ICD-10-CM | POA: Diagnosis not present

## 2021-01-04 DIAGNOSIS — G43809 Other migraine, not intractable, without status migrainosus: Secondary | ICD-10-CM | POA: Diagnosis not present

## 2021-02-12 DIAGNOSIS — Z20822 Contact with and (suspected) exposure to covid-19: Secondary | ICD-10-CM | POA: Diagnosis not present

## 2021-02-12 DIAGNOSIS — R059 Cough, unspecified: Secondary | ICD-10-CM | POA: Diagnosis not present

## 2021-03-28 DIAGNOSIS — J40 Bronchitis, not specified as acute or chronic: Secondary | ICD-10-CM | POA: Diagnosis not present

## 2021-03-28 DIAGNOSIS — J210 Acute bronchiolitis due to respiratory syncytial virus: Secondary | ICD-10-CM | POA: Diagnosis not present

## 2021-05-18 DIAGNOSIS — M25531 Pain in right wrist: Secondary | ICD-10-CM | POA: Diagnosis not present

## 2021-05-28 DIAGNOSIS — J0100 Acute maxillary sinusitis, unspecified: Secondary | ICD-10-CM | POA: Diagnosis not present

## 2021-05-28 DIAGNOSIS — R051 Acute cough: Secondary | ICD-10-CM | POA: Diagnosis not present

## 2021-05-28 DIAGNOSIS — R0789 Other chest pain: Secondary | ICD-10-CM | POA: Diagnosis not present

## 2021-06-17 DIAGNOSIS — H8309 Labyrinthitis, unspecified ear: Secondary | ICD-10-CM | POA: Diagnosis not present

## 2021-06-17 DIAGNOSIS — R42 Dizziness and giddiness: Secondary | ICD-10-CM | POA: Diagnosis not present

## 2021-06-17 DIAGNOSIS — R110 Nausea: Secondary | ICD-10-CM | POA: Diagnosis not present

## 2021-06-18 DIAGNOSIS — I10 Essential (primary) hypertension: Secondary | ICD-10-CM | POA: Diagnosis not present

## 2021-07-04 DIAGNOSIS — D72829 Elevated white blood cell count, unspecified: Secondary | ICD-10-CM | POA: Diagnosis not present

## 2021-09-18 DIAGNOSIS — Z13220 Encounter for screening for lipoid disorders: Secondary | ICD-10-CM | POA: Diagnosis not present

## 2021-09-18 DIAGNOSIS — Z125 Encounter for screening for malignant neoplasm of prostate: Secondary | ICD-10-CM | POA: Diagnosis not present

## 2021-09-18 DIAGNOSIS — E7889 Other lipoprotein metabolism disorders: Secondary | ICD-10-CM | POA: Diagnosis not present

## 2021-09-18 DIAGNOSIS — Z0000 Encounter for general adult medical examination without abnormal findings: Secondary | ICD-10-CM | POA: Diagnosis not present

## 2021-10-07 DIAGNOSIS — M47814 Spondylosis without myelopathy or radiculopathy, thoracic region: Secondary | ICD-10-CM | POA: Diagnosis not present

## 2021-10-07 DIAGNOSIS — R059 Cough, unspecified: Secondary | ICD-10-CM | POA: Diagnosis not present

## 2021-10-07 DIAGNOSIS — R079 Chest pain, unspecified: Secondary | ICD-10-CM | POA: Diagnosis not present

## 2021-10-10 DIAGNOSIS — I2109 ST elevation (STEMI) myocardial infarction involving other coronary artery of anterior wall: Secondary | ICD-10-CM

## 2021-10-10 HISTORY — DX: ST elevation (STEMI) myocardial infarction involving other coronary artery of anterior wall: I21.09

## 2021-10-15 ENCOUNTER — Encounter (HOSPITAL_COMMUNITY): Admission: EM | Disposition: A | Discharge status: Home / Self Care | Payer: Self-pay | Source: Home / Self Care

## 2021-10-15 ENCOUNTER — Other Ambulatory Visit: Payer: Self-pay

## 2021-10-15 ENCOUNTER — Inpatient Hospital Stay (HOSPITAL_BASED_OUTPATIENT_CLINIC_OR_DEPARTMENT_OTHER): Payer: Medicaid Other

## 2021-10-15 ENCOUNTER — Encounter (HOSPITAL_COMMUNITY): Payer: Self-pay | Admitting: Cardiology

## 2021-10-15 ENCOUNTER — Observation Stay (HOSPITAL_COMMUNITY)
Admission: EM | Admit: 2021-10-15 | Discharge: 2021-10-16 | Disposition: A | Discharge status: Home / Self Care | Payer: Medicaid Other | Attending: Cardiology | Admitting: Cardiology

## 2021-10-15 DIAGNOSIS — I25110 Atherosclerotic heart disease of native coronary artery with unstable angina pectoris: Secondary | ICD-10-CM | POA: Diagnosis not present

## 2021-10-15 DIAGNOSIS — I2510 Atherosclerotic heart disease of native coronary artery without angina pectoris: Secondary | ICD-10-CM

## 2021-10-15 DIAGNOSIS — I213 ST elevation (STEMI) myocardial infarction of unspecified site: Secondary | ICD-10-CM | POA: Diagnosis not present

## 2021-10-15 DIAGNOSIS — R52 Pain, unspecified: Secondary | ICD-10-CM | POA: Diagnosis not present

## 2021-10-15 DIAGNOSIS — F17210 Nicotine dependence, cigarettes, uncomplicated: Secondary | ICD-10-CM | POA: Diagnosis not present

## 2021-10-15 DIAGNOSIS — I200 Unstable angina: Secondary | ICD-10-CM

## 2021-10-15 DIAGNOSIS — I219 Acute myocardial infarction, unspecified: Secondary | ICD-10-CM | POA: Diagnosis not present

## 2021-10-15 DIAGNOSIS — I10 Essential (primary) hypertension: Secondary | ICD-10-CM | POA: Diagnosis not present

## 2021-10-15 DIAGNOSIS — I959 Hypotension, unspecified: Secondary | ICD-10-CM | POA: Diagnosis not present

## 2021-10-15 DIAGNOSIS — R079 Chest pain, unspecified: Secondary | ICD-10-CM | POA: Diagnosis present

## 2021-10-15 HISTORY — PX: LEFT HEART CATH AND CORONARY ANGIOGRAPHY: CATH118249

## 2021-10-15 HISTORY — DX: Bronchitis, not specified as acute or chronic: J40

## 2021-10-15 LAB — HEMOGLOBIN A1C
Hgb A1c MFr Bld: 5.8 % — ABNORMAL HIGH (ref 4.8–5.6)
Hgb A1c MFr Bld: 5.8 % — ABNORMAL HIGH (ref 4.8–5.6)
Mean Plasma Glucose: 119.76 mg/dL
Mean Plasma Glucose: 119.76 mg/dL

## 2021-10-15 LAB — CBC WITH DIFFERENTIAL/PLATELET
Abs Immature Granulocytes: 0.22 10*3/uL — ABNORMAL HIGH (ref 0.00–0.07)
Basophils Absolute: 0.1 10*3/uL (ref 0.0–0.1)
Basophils Relative: 1 %
Eosinophils Absolute: 0.3 10*3/uL (ref 0.0–0.5)
Eosinophils Relative: 3 %
HCT: 40.8 % (ref 39.0–52.0)
Hemoglobin: 14.4 g/dL (ref 13.0–17.0)
Immature Granulocytes: 2 %
Lymphocytes Relative: 23 %
Lymphs Abs: 2.4 10*3/uL (ref 0.7–4.0)
MCH: 33.5 pg (ref 26.0–34.0)
MCHC: 35.3 g/dL (ref 30.0–36.0)
MCV: 94.9 fL (ref 80.0–100.0)
Monocytes Absolute: 0.8 10*3/uL (ref 0.1–1.0)
Monocytes Relative: 7 %
Neutro Abs: 6.9 10*3/uL (ref 1.7–7.7)
Neutrophils Relative %: 64 %
Platelets: 349 10*3/uL (ref 150–400)
RBC: 4.3 MIL/uL (ref 4.22–5.81)
RDW: 12.6 % (ref 11.5–15.5)
WBC: 10.7 10*3/uL — ABNORMAL HIGH (ref 4.0–10.5)
nRBC: 0 % (ref 0.0–0.2)

## 2021-10-15 LAB — COMPREHENSIVE METABOLIC PANEL
ALT: 20 U/L (ref 0–44)
AST: 16 U/L (ref 15–41)
Albumin: 3.6 g/dL (ref 3.5–5.0)
Alkaline Phosphatase: 56 U/L (ref 38–126)
Anion gap: 11 (ref 5–15)
BUN: 13 mg/dL (ref 6–20)
CO2: 24 mmol/L (ref 22–32)
Calcium: 8.9 mg/dL (ref 8.9–10.3)
Chloride: 105 mmol/L (ref 98–111)
Creatinine, Ser: 0.91 mg/dL (ref 0.61–1.24)
GFR, Estimated: >60 mL/min (ref >60)
Glucose, Bld: 157 mg/dL — ABNORMAL HIGH (ref 70–99)
Potassium: 3.2 mmol/L — ABNORMAL LOW (ref 3.5–5.1)
Sodium: 140 mmol/L (ref 135–145)
Total Bilirubin: 0.5 mg/dL (ref 0.3–1.2)
Total Protein: 6.2 g/dL — ABNORMAL LOW (ref 6.5–8.1)

## 2021-10-15 LAB — PROTIME-INR
INR: 1 (ref 0.8–1.2)
Prothrombin Time: 12.9 seconds (ref 11.4–15.2)

## 2021-10-15 LAB — LIPID PANEL
Cholesterol: 127 mg/dL (ref 0–200)
HDL: 33 mg/dL — ABNORMAL LOW (ref >40)
LDL Cholesterol: 72 mg/dL (ref 0–99)
Total CHOL/HDL Ratio: 3.8 RATIO
Triglycerides: 112 mg/dL (ref <150)
VLDL: 22 mg/dL (ref 0–40)

## 2021-10-15 LAB — TROPONIN I (HIGH SENSITIVITY)
Troponin I (High Sensitivity): 49 ng/L — ABNORMAL HIGH (ref <18)
Troponin I (High Sensitivity): 340 ng/L (ref <18)
Troponin I (High Sensitivity): 299 ng/L (ref <18)

## 2021-10-15 LAB — HIV ANTIBODY (ROUTINE TESTING W REFLEX): HIV Screen 4th Generation wRfx: NONREACTIVE

## 2021-10-15 LAB — APTT: aPTT: 44 seconds — ABNORMAL HIGH (ref 24–36)

## 2021-10-15 LAB — ECHOCARDIOGRAM COMPLETE
Area-P 1/2: 4.39 cm2
S' Lateral: 2.1 cm

## 2021-10-15 LAB — POCT ACTIVATED CLOTTING TIME: Activated Clotting Time: 149 seconds

## 2021-10-15 SURGERY — LEFT HEART CATH AND CORONARY ANGIOGRAPHY
Anesthesia: LOCAL

## 2021-10-15 MED ORDER — LIDOCAINE HCL (PF) 1 % IJ SOLN
INTRAMUSCULAR | Status: DC | PRN
  Administered 2021-10-15: 2 mL via INTRADERMAL

## 2021-10-15 MED ORDER — ATORVASTATIN CALCIUM 40 MG PO TABS
40.0000 mg | ORAL_TABLET | Freq: Every day | ORAL | Status: DC
  Administered 2021-10-15 – 2021-10-16 (×2): 40 mg via ORAL
  Filled 2021-10-15 (×2): qty 1

## 2021-10-15 MED ORDER — LIDOCAINE HCL (PF) 1 % IJ SOLN
INTRAMUSCULAR | Status: AC
  Filled 2021-10-15: qty 30

## 2021-10-15 MED ORDER — ISOSORBIDE MONONITRATE ER 30 MG PO TB24
30.0000 mg | ORAL_TABLET | Freq: Every day | ORAL | Status: DC
Start: 2021-10-15 — End: 2021-10-16
  Administered 2021-10-15 – 2021-10-16 (×2): 30 mg via ORAL
  Filled 2021-10-15 (×2): qty 1

## 2021-10-15 MED ORDER — HEPARIN (PORCINE) IN NACL 1000-0.9 UT/500ML-% IV SOLN
INTRAVENOUS | Status: AC
  Filled 2021-10-15: qty 1000

## 2021-10-15 MED ORDER — VERAPAMIL HCL 2.5 MG/ML IV SOLN
INTRAVENOUS | Status: AC
  Filled 2021-10-15: qty 2

## 2021-10-15 MED ORDER — NITROGLYCERIN 0.4 MG SL SUBL: 0.4000 mg | SUBLINGUAL_TABLET | SUBLINGUAL | Status: DC | PRN

## 2021-10-15 MED ORDER — ACETAMINOPHEN 325 MG PO TABS
650.0000 mg | ORAL_TABLET | ORAL | Status: DC | PRN
  Administered 2021-10-16: 650 mg via ORAL
  Filled 2021-10-15: qty 2

## 2021-10-15 MED ORDER — FENTANYL CITRATE (PF) 100 MCG/2ML IJ SOLN
INTRAMUSCULAR | Status: AC
  Filled 2021-10-15: qty 2

## 2021-10-15 MED ORDER — HEPARIN SODIUM (PORCINE) 1000 UNIT/ML IJ SOLN
INTRAMUSCULAR | Status: AC
  Filled 2021-10-15: qty 10

## 2021-10-15 MED ORDER — HYDRALAZINE HCL 20 MG/ML IJ SOLN: 10.0000 mg | INTRAMUSCULAR | Status: AC | PRN

## 2021-10-15 MED ORDER — ASPIRIN 81 MG PO TBEC
81.0000 mg | DELAYED_RELEASE_TABLET | Freq: Every day | ORAL | Status: DC
  Administered 2021-10-16: 81 mg via ORAL
  Filled 2021-10-15: qty 1

## 2021-10-15 MED ORDER — METOPROLOL SUCCINATE ER 25 MG PO TB24
25.0000 mg | ORAL_TABLET | Freq: Every day | ORAL | Status: DC
  Administered 2021-10-15 – 2021-10-16 (×2): 25 mg via ORAL
  Filled 2021-10-15 (×2): qty 1

## 2021-10-15 MED ORDER — HEPARIN (PORCINE) IN NACL 1000-0.9 UT/500ML-% IV SOLN
INTRAVENOUS | Status: DC | PRN
  Administered 2021-10-15 (×2): 500 mL

## 2021-10-15 MED ORDER — SODIUM CHLORIDE 0.9% FLUSH: 3.0000 mL | Freq: Two times a day (BID) | INTRAVENOUS | Status: DC

## 2021-10-15 MED ORDER — ENOXAPARIN SODIUM 40 MG/0.4ML IJ SOSY: 40.0000 mg | PREFILLED_SYRINGE | INTRAMUSCULAR | Status: DC

## 2021-10-15 MED ORDER — ONDANSETRON HCL 4 MG/2ML IJ SOLN: 4.0000 mg | Freq: Four times a day (QID) | INTRAMUSCULAR | Status: DC | PRN

## 2021-10-15 MED ORDER — FENTANYL CITRATE (PF) 100 MCG/2ML IJ SOLN
INTRAMUSCULAR | Status: DC | PRN
  Administered 2021-10-15: 50 ug via INTRAVENOUS

## 2021-10-15 MED ORDER — HEPARIN SODIUM (PORCINE) 1000 UNIT/ML IJ SOLN
INTRAMUSCULAR | Status: DC | PRN
  Administered 2021-10-15: 3000 [IU] via INTRAVENOUS

## 2021-10-15 MED ORDER — SODIUM CHLORIDE 0.9 % IV SOLN: 250.0000 mL | INTRAVENOUS | Status: DC | PRN

## 2021-10-15 MED ORDER — TICAGRELOR 90 MG PO TABS
180.0000 mg | ORAL_TABLET | Freq: Once | ORAL | Status: AC
Start: 2021-10-15 — End: 2021-10-15
  Administered 2021-10-15: 180 mg via ORAL
  Filled 2021-10-15: qty 2

## 2021-10-15 MED ORDER — ENOXAPARIN SODIUM 40 MG/0.4ML IJ SOSY
40.0000 mg | PREFILLED_SYRINGE | INTRAMUSCULAR | Status: DC
  Administered 2021-10-16: 40 mg via SUBCUTANEOUS
  Filled 2021-10-15: qty 0.4

## 2021-10-15 MED ORDER — VERAPAMIL HCL 2.5 MG/ML IV SOLN
INTRAVENOUS | Status: DC | PRN
  Administered 2021-10-15: 10 mL via INTRA_ARTERIAL

## 2021-10-15 MED ORDER — SODIUM CHLORIDE 0.9% FLUSH: 3.0000 mL | INTRAVENOUS | Status: DC | PRN

## 2021-10-15 MED ORDER — SODIUM CHLORIDE 0.9 % IV SOLN: INTRAVENOUS | Status: AC

## 2021-10-15 MED ORDER — LABETALOL HCL 5 MG/ML IV SOLN: 10.0000 mg | INTRAVENOUS | Status: AC | PRN

## 2021-10-15 MED ORDER — TICAGRELOR 90 MG PO TABS
90.0000 mg | ORAL_TABLET | Freq: Two times a day (BID) | ORAL | Status: DC
  Administered 2021-10-15 – 2021-10-16 (×2): 90 mg via ORAL
  Filled 2021-10-15 (×2): qty 1

## 2021-10-15 SURGICAL SUPPLY — 12 items
BAND ZEPHYR COMPRESS 30 LONG (HEMOSTASIS) ×1 IMPLANT
CATH 5FR PIGTAIL DIAGNOSTIC (CATHETERS) ×1 IMPLANT
CATH INFINITI JR4 5F (CATHETERS) ×1 IMPLANT
CATH VISTA GUIDE 6FR XBLAD3.5 (CATHETERS) ×2 IMPLANT
GLIDESHEATH SLEND SS 6F .021 (SHEATH) ×1 IMPLANT
GUIDEWIRE INQWIRE 1.5J.035X260 (WIRE) IMPLANT
INQWIRE 1.5J .035X260CM (WIRE) ×2
KIT ENCORE 26 ADVANTAGE (KITS) ×1 IMPLANT
KIT HEART LEFT (KITS) ×2 IMPLANT
PACK CARDIAC CATHETERIZATION (CUSTOM PROCEDURE TRAY) ×2 IMPLANT
TRANSDUCER W/STOPCOCK (MISCELLANEOUS) ×2 IMPLANT
TUBING CIL FLEX 10 FLL-RA (TUBING) ×2 IMPLANT

## 2021-10-15 NOTE — Progress Notes (Signed)
Pt leaves cath lab holding area in stable condition.  5cc in TR band Rt radial. Radial site is CDI.Rt radial pulse is present.

## 2021-10-15 NOTE — TOC Initial Note (Signed)
Transition of Care [facility]) - Initial/Assessment Note    Patient Details  Name: Curtis Marshall MRN: [id number] Date of Birth: Nov 19, 1970  Transition of Care [facility]) CM/SW Contact:    Durenda Guthrie, RN Phone Number: 10/15/2021, 3:01 PM  Clinical Narrative:                 Transition of Care Screening Note:  Transition of Care Department [facility]) has reviewed patient and no TOC needs have been identified at this time. We will continue to monitor patient advancement through Interdisciplinary progressions. If new patient transition needs arise, please place a consult.          Patient Goals and CMS Choice        Expected Discharge Plan and Services                                                Prior Living Arrangements/Services                       Activities of Daily Living      Permission Sought/Granted                  Emotional Assessment              Admission diagnosis:  Unstable angina (HCC) [I20.0] Patient Active Problem List   Diagnosis Date Noted   Unstable angina [facility])    PCP:  Lysbeth Galas, NP Pharmacy:   [facility] [place], [place] - [address] [place] [address] Phone: [phone] Fax: [phone]     Social Determinants of Health (SDOH) Interventions    Readmission Risk Interventions     View : No data to display.

## 2021-10-15 NOTE — Progress Notes (Signed)
Troponin level called in at 340. Earlier troponin level at 49. Rosaria Ferries PA-C updated via secure chat. Pt asymptomatic. No new orders received. Continue with present treatment plan  per Dr. Martinique.

## 2021-10-15 NOTE — H&P (Signed)
Cardiology Admission History and Physical:   Patient ID: Curtis Marshall MRN: [id number]; DOB: Sep 16, 1970   Admission date: 10/15/2021  PCP:  Curtis Galas, NP   [facility] Providers Cardiologist:  None        Chief Complaint:  chest pain  Patient Profile:   Curtis Marshall is a 51 y.o. male with history of HTN and tobacco abuse who is being seen 10/15/2021 for the evaluation of STEMI.  History of Present Illness:   Curtis Marshall has a history of HTN and tobacco abuse. He was recently seen in the ED one week ago and treated for bronchitis with steroids and antibiotics. Today he was driving in his car when he experienced sudden onset  severe mid sternal chest pain radiating to the right arm. He states he through his cigarettes out the window and went to the ED at [facility]. Initial Ecg there showed ST elevation 2 mm in the anterior precordial leads. This was new compared to old Ecg. He was given ASA and IV heparin bolus. Code STEMI was activated and patient transported for emergent cardiac cath. In route his chest pain improved markedly and repeat Ecg showed normalization of ST elevation. No prior cardiac history. No history of DM or HLD. Father did have heart disease.    Past Medical History:  Diagnosis Date   Bronchitis    Hypertension     History reviewed. No pertinent surgical history.   Medications Prior to Admission: Prior to Admission medications   Not on File     Allergies:    Allergies  Allergen Reactions   Pepto-Bismol [Bismuth Subsalicylate] Nausea And Vomiting    Social History:   Social History   Socioeconomic History   Marital status: Single    Spouse name: Not on file   Number of children: Not on file   Years of education: Not on file   Highest education level: Not on file  Occupational History   Occupation: works at [employer]  Tobacco Use   Smoking status: Every Day    Packs/day: 1.00    Types: Cigarettes   Smokeless  tobacco: Never  Substance and Sexual Activity   Alcohol use: Never   Drug use: Never   Sexual activity: Not on file  Other Topics Concern   Not on file  Social History Narrative   Not on file   Social Determinants of Health   Financial Resource Strain: Not on file  Food Insecurity: Not on file  Transportation Needs: Not on file  Physical Activity: Not on file  Stress: Not on file  Social Connections: Not on file  Intimate Partner Violence: Not on file    Family History:   The patient's family history includes Heart disease in his father.    ROS:  Please see the history of present illness.  All other ROS reviewed and negative.     Physical Exam/Data:   Vitals:   10/15/21 1225 10/15/21 1226 10/15/21 1230 10/15/21 1235  BP: (!) 166/84 (!) 166/84 (!) 160/79 (!) 167/85  Pulse: 73 72 71 78  Resp:  11 12 15   SpO2: 96% 96% 94% 95%    Intake/Output Summary (Last 24 hours) at 10/15/2021 1319 Last data filed at 10/15/2021 1200 Gross per 24 hour  Intake --  Output 550 ml  Net -550 ml      01/22/2020    8:46 PM  Last 3 Weights  Weight (lbs) 278 lb  Weight (kg) 126.1 kg  There is no height or weight on file to calculate BMI.  General:  Well nourished, well developed, in no acute distress HEENT: normal Neck: no JVD Vascular: No carotid bruits; Distal pulses 2+ bilaterally   Cardiac:  normal S1, S2; RRR; no murmur  Lungs:  clear to auscultation bilaterally, no wheezing, rhonchi or rales  Abd: soft, nontender, no hepatomegaly  Ext: no edema Musculoskeletal:  No deformities, BUE and BLE strength normal and equal Skin: warm and dry  Neuro:  CNs 2-12 intact, no focal abnormalities noted Psych:  Normal affect    EKG:  The ECG that was done now  was personally reviewed and demonstrates NSR with no acute ST changes.  Relevant CV Studies: none  Laboratory Data:  High Sensitivity Troponin:   Recent Labs  Lab 10/15/21 1143  TROPONINIHS 49*      Chemistry Recent  Labs  Lab 10/15/21 1143  NA 140  K 3.2*  CL 105  CO2 24  GLUCOSE 157*  BUN 13  CREATININE 0.91  CALCIUM 8.9  GFRNONAA >60  ANIONGAP 11    Recent Labs  Lab 10/15/21 1143  PROT 6.2*  ALBUMIN 3.6  AST 16  ALT 20  ALKPHOS 56  BILITOT 0.5   Lipids No results for input(s): CHOL, TRIG, HDL, LABVLDL, LDLCALC, CHOLHDL in the last 168 hours. Hematology Recent Labs  Lab 10/15/21 1143  WBC 10.7*  RBC 4.30  HGB 14.4  HCT 40.8  MCV 94.9  MCH 33.5  MCHC 35.3  RDW 12.6  PLT 349   Thyroid No results for input(s): TSH, FREET4 in the last 168 hours. BNPNo results for input(s): BNP, PROBNP in the last 168 hours.  DDimer No results for input(s): DDIMER in the last 168 hours.   Radiology/Studies:  CARDIAC CATHETERIZATION  Result Date: 10/15/2021   Ost LAD to Mid LAD lesion is 20% stenosed.   The left ventricular systolic function is normal.   LV end diastolic pressure is moderately elevated.   The left ventricular ejection fraction is 55-65% by visual estimate. Mild nonobstructive CAD. No culprit lesion seen Moderately elevated LVEDP Normal LV function Plan: etiology of acute syndrome could be vasospasm, resolved thrombus or HTN. Recommend medical management with DAPT, BP control and smoking cessation. Will check Echo.     Assessment and Plan:   Acute coronary syndrome. Presented with acute typical chest pain and Ecg concerning for acute anterior infarction. Fortunately pain resolved as did ST elevation suggesting reperfusion. Plan emergent cardiac cath with management as necessary. Will initiate ASA, beta blocker, statin. Further management depending on cath results HTN. On lisinopril at home. Will likely need additional therapy Tobacco abuse. Counseled on smoking cessation.    Risk Assessment/Risk Scores:    TIMI Risk Score for Unstable Angina or Non-ST Elevation MI:   The patient's TIMI risk score is 3, which indicates a 13% risk of all cause mortality, new or recurrent  myocardial infarction or need for urgent revascularization in the next 14 days.       Severity of Illness: The appropriate patient status for this patient is INPATIENT. Inpatient status is judged to be reasonable and necessary in order to provide the required intensity of service to ensure the patient's safety. The patient's presenting symptoms, physical exam findings, and initial radiographic and laboratory data in the context of their chronic comorbidities is felt to place them at high risk for further clinical deterioration. Furthermore, it is not anticipated that the patient will be medically stable for discharge  from the hospital within 2 midnights of admission.   * I certify that at the point of admission it is my clinical judgment that the patient will require inpatient hospital care spanning beyond 2 midnights from the point of admission due to high intensity of service, high risk for further deterioration and high frequency of surveillance required.*   For questions or updates, please contact [facility] Please consult [email or web address] for contact info under     Signed, Lacye Mccarn [place], MD  10/15/2021 1:19 PM

## 2021-10-16 ENCOUNTER — Other Ambulatory Visit (HOSPITAL_COMMUNITY): Payer: Self-pay

## 2021-10-16 DIAGNOSIS — I25110 Atherosclerotic heart disease of native coronary artery with unstable angina pectoris: Secondary | ICD-10-CM | POA: Diagnosis not present

## 2021-10-16 DIAGNOSIS — I214 Non-ST elevation (NSTEMI) myocardial infarction: Secondary | ICD-10-CM

## 2021-10-16 DIAGNOSIS — R7303 Prediabetes: Secondary | ICD-10-CM | POA: Diagnosis not present

## 2021-10-16 DIAGNOSIS — I10 Essential (primary) hypertension: Secondary | ICD-10-CM | POA: Diagnosis not present

## 2021-10-16 DIAGNOSIS — F17210 Nicotine dependence, cigarettes, uncomplicated: Secondary | ICD-10-CM | POA: Diagnosis not present

## 2021-10-16 LAB — POCT I-STAT, CHEM 8
BUN: 14 mg/dL (ref 6–20)
Calcium, Ion: 1.22 mmol/L (ref 1.15–1.40)
Chloride: 101 mmol/L (ref 98–111)
Creatinine, Ser: 0.7 mg/dL (ref 0.61–1.24)
Glucose, Bld: 163 mg/dL — ABNORMAL HIGH (ref 70–99)
HCT: 41 % (ref 39.0–52.0)
Hemoglobin: 13.9 g/dL (ref 13.0–17.0)
Potassium: 3.4 mmol/L — ABNORMAL LOW (ref 3.5–5.1)
Sodium: 140 mmol/L (ref 135–145)
TCO2: 25 mmol/L (ref 22–32)

## 2021-10-16 LAB — CBC
HCT: 39.1 % (ref 39.0–52.0)
Hemoglobin: 13.9 g/dL (ref 13.0–17.0)
MCH: 34.3 pg — ABNORMAL HIGH (ref 26.0–34.0)
MCHC: 35.5 g/dL (ref 30.0–36.0)
MCV: 96.5 fL (ref 80.0–100.0)
Platelets: 306 10*3/uL (ref 150–400)
RBC: 4.05 MIL/uL — ABNORMAL LOW (ref 4.22–5.81)
RDW: 12.7 % (ref 11.5–15.5)
WBC: 13.2 10*3/uL — ABNORMAL HIGH (ref 4.0–10.5)
nRBC: 0 % (ref 0.0–0.2)

## 2021-10-16 LAB — LIPID PANEL
Cholesterol: 132 mg/dL (ref 0–200)
HDL: 30 mg/dL — ABNORMAL LOW (ref >40)
LDL Cholesterol: 74 mg/dL (ref 0–99)
Total CHOL/HDL Ratio: 4.4 RATIO
Triglycerides: 142 mg/dL (ref <150)
VLDL: 28 mg/dL (ref 0–40)

## 2021-10-16 LAB — BASIC METABOLIC PANEL
Anion gap: 4 — ABNORMAL LOW (ref 5–15)
BUN: 14 mg/dL (ref 6–20)
CO2: 27 mmol/L (ref 22–32)
Calcium: 9.1 mg/dL (ref 8.9–10.3)
Chloride: 104 mmol/L (ref 98–111)
Creatinine, Ser: 0.76 mg/dL (ref 0.61–1.24)
GFR, Estimated: >60 mL/min (ref >60)
Glucose, Bld: 101 mg/dL — ABNORMAL HIGH (ref 70–99)
Potassium: 3.8 mmol/L (ref 3.5–5.1)
Sodium: 135 mmol/L (ref 135–145)

## 2021-10-16 MED ORDER — ISOSORBIDE MONONITRATE ER 30 MG PO TB24
30.0000 mg | ORAL_TABLET | Freq: Every day | ORAL | 6 refills | Status: DC
Start: 2021-10-17 — End: 2021-11-18
  Filled 2021-10-16: qty 30, 30d supply, fill #0

## 2021-10-16 MED ORDER — METOPROLOL SUCCINATE ER 25 MG PO TB24
25.0000 mg | ORAL_TABLET | Freq: Every day | ORAL | 6 refills | Status: DC
  Filled 2021-10-16: qty 30, 30d supply, fill #0

## 2021-10-16 MED ORDER — ATORVASTATIN CALCIUM 40 MG PO TABS
40.0000 mg | ORAL_TABLET | Freq: Every day | ORAL | 6 refills | Status: DC
Start: 2021-10-17 — End: 2021-11-18
  Filled 2021-10-16: qty 30, 30d supply, fill #0

## 2021-10-16 MED ORDER — ASPIRIN 81 MG PO TBEC
81.0000 mg | DELAYED_RELEASE_TABLET | Freq: Every day | ORAL | 12 refills | Status: AC
  Filled 2021-10-16: qty 30, 30d supply, fill #0

## 2021-10-16 MED ORDER — NITROGLYCERIN 0.4 MG SL SUBL
0.4000 mg | SUBLINGUAL_TABLET | SUBLINGUAL | 12 refills | Status: AC | PRN
  Filled 2021-10-16: qty 25, 7d supply, fill #0

## 2021-10-16 MED ORDER — TICAGRELOR 90 MG PO TABS
90.0000 mg | ORAL_TABLET | Freq: Two times a day (BID) | ORAL | 12 refills | Status: DC
  Filled 2021-10-16: qty 60, 30d supply, fill #0

## 2021-10-16 NOTE — Progress Notes (Signed)
Progress Note  Patient Name: Curtis Marshall Date of Encounter: 10/16/2021  [facility] Cardiologist: None Peter Martinique  Subjective   No complaints.  Anxious to go home.  Inpatient Medications    Scheduled Meds:  aspirin EC  81 mg Oral Daily   atorvastatin  40 mg Oral Daily   enoxaparin (LOVENOX) injection  40 mg Subcutaneous Q24H   isosorbide mononitrate  30 mg Oral Daily   metoprolol succinate  25 mg Oral Daily   sodium chloride flush  3 mL Intravenous Q12H   ticagrelor  90 mg Oral BID   Continuous Infusions:  sodium chloride     PRN Meds: sodium chloride, acetaminophen, nitroGLYCERIN, ondansetron (ZOFRAN) IV, sodium chloride flush   Vital Signs    Vitals:   10/16/21 0439 10/16/21 0740 10/16/21 0747 10/16/21 0803  BP: 128/86 140/75  137/77  Pulse: 68 60  (!) 59  Resp: 16 18  18   Temp: (!) 96.5 F (35.8 C) (!) 97.5 F (36.4 C)  97.7 F (36.5 C)  TempSrc: Axillary Oral  Oral  SpO2:    96%  Weight:   122.9 kg   Height:   6\' 2"  (1.88 m)     Intake/Output Summary (Last 24 hours) at 10/16/2021 1106 Last data filed at 10/16/2021 0740 Gross per 24 hour  Intake 600 ml  Output 550 ml  Net 50 ml      10/16/2021    7:47 AM 01/22/2020    8:46 PM  Last 3 Weights  Weight (lbs) 271 lb 278 lb  Weight (kg) 122.925 kg 126.1 kg      Telemetry    Normal sinus rhythm- Personally Reviewed  ECG    Performed 10/16/2021 reveals interventricular conduction delay with RSR prime V1.  No acute changes noted.  No significant change is noted compared to 2021- Personally Reviewed  Physical Exam  Bearded, obese GEN: No acute distress.   Neck: No JVD Cardiac: RRR, no murmurs, rubs, or gallops.  Cardiac cath site is remarkable. Respiratory: Clear to auscultation bilaterally. GI: Soft, nontender, non-distended  MS: No edema; No deformity. Neuro:  Nonfocal  Psych: Normal affect   Labs    High Sensitivity Troponin:   Recent Labs  Lab 10/15/21 1143 10/15/21 1559  10/15/21 1829  TROPONINIHS 49* 340* 299*     Chemistry Recent Labs  Lab 10/15/21 1143 10/16/21 0928  NA 140 135  K 3.2* 3.8  CL 105 104  CO2 24 27  GLUCOSE 157* 101*  BUN 13 14  CREATININE 0.91 0.76  CALCIUM 8.9 9.1  PROT 6.2*  --   ALBUMIN 3.6  --   AST 16  --   ALT 20  --   ALKPHOS 56  --   BILITOT 0.5  --   GFRNONAA >60 >60  ANIONGAP 11 4*    Lipids  Recent Labs  Lab 10/16/21 0228  CHOL 132  TRIG 142  HDL 30*  LDLCALC 74  CHOLHDL 4.4    Hematology Recent Labs  Lab 10/15/21 1143 10/16/21 0928  WBC 10.7* 13.2*  RBC 4.30 4.05*  HGB 14.4 13.9  HCT 40.8 39.1  MCV 94.9 96.5  MCH 33.5 34.3*  MCHC 35.3 35.5  RDW 12.6 12.7  PLT 349 306   Thyroid No results for input(s): TSH, FREET4 in the last 168 hours.  BNPNo results for input(s): BNP, PROBNP in the last 168 hours.  DDimer No results for input(s): DDIMER in the last 168 hours.   Radiology  CARDIAC CATHETERIZATION  Result Date: 10/15/2021   Ost LAD to Mid LAD lesion is 20% stenosed.   The left ventricular systolic function is normal.   LV end diastolic pressure is moderately elevated.   The left ventricular ejection fraction is 55-65% by visual estimate. Mild nonobstructive CAD. No culprit lesion seen Moderately elevated LVEDP Normal LV function Plan: etiology of acute syndrome could be vasospasm, resolved thrombus or HTN. Recommend medical management with DAPT, BP control and smoking cessation. Will check Echo.   ECHOCARDIOGRAM COMPLETE  Result Date: 10/15/2021    ECHOCARDIOGRAM REPORT   Patient Name:   Curtis Marshall Date of Exam: 10/15/2021 Medical Rec #:  [id number]        Height:       75.0 in Accession #:    [id number]       Weight:       278.0 lb Date of Birth:  04/20/1971        BSA:          2.524 m Patient Age:    51 years         BP:           144/88 mmHg Patient Gender: M                HR:           64 bpm. Exam Location:  Inpatient Procedure: 2D Echo, Color Doppler and Cardiac Doppler  Indications:    CAD Native Vessel i25.10  History:        Patient has no prior history of Echocardiogram examinations.                 Risk Factors:Hypertension and Current Smoker.  Sonographer:    Raquel Sarna Senior RDCS Referring Phys: [id number] PETER M Martinique  Sonographer Comments: Technically difficult due to lung interference IMPRESSIONS  1. Left ventricular ejection fraction, by estimation, is 55 to 60%. The left ventricle has normal function. The left ventricle has no regional wall motion abnormalities. There is moderate concentric left ventricular hypertrophy. Left ventricular diastolic parameters were normal.  2. Right ventricular systolic function is normal. The right ventricular size is normal. Tricuspid regurgitation signal is inadequate for assessing PA pressure.  3. The mitral valve is grossly normal. Trivial mitral valve regurgitation. No evidence of mitral stenosis.  4. The aortic valve is grossly normal. Aortic valve regurgitation is not visualized. No aortic stenosis is present.  5. The inferior vena cava is dilated in size with >50% respiratory variability, suggesting right atrial pressure of 8 mmHg. FINDINGS  Left Ventricle: Left ventricular ejection fraction, by estimation, is 55 to 60%. The left ventricle has normal function. The left ventricle has no regional wall motion abnormalities. The left ventricular internal cavity size was normal in size. There is  moderate concentric left ventricular hypertrophy. Left ventricular diastolic parameters were normal. Right Ventricle: The right ventricular size is normal. No increase in right ventricular wall thickness. Right ventricular systolic function is normal. Tricuspid regurgitation signal is inadequate for assessing PA pressure. Left Atrium: Left atrial size was normal in size. Right Atrium: Right atrial size was normal in size. Pericardium: There is no evidence of pericardial effusion. Mitral Valve: The mitral valve is grossly normal. Trivial mitral valve  regurgitation. No evidence of mitral valve stenosis. Tricuspid Valve: The tricuspid valve is grossly normal. Tricuspid valve regurgitation is trivial. No evidence of tricuspid stenosis. Aortic Valve: The aortic valve is grossly normal. Aortic valve regurgitation is not visualized. No  aortic stenosis is present. Pulmonic Valve: The pulmonic valve was grossly normal. Pulmonic valve regurgitation is not visualized. No evidence of pulmonic stenosis. Aorta: The aortic root and ascending aorta are structurally normal, with no evidence of dilitation. Venous: The inferior vena cava is dilated in size with greater than 50% respiratory variability, suggesting right atrial pressure of 8 mmHg. IAS/Shunts: The atrial septum is grossly normal.  LEFT VENTRICLE PLAX 2D LVIDd:         3.35 cm   Diastology LVIDs:         2.10 cm   LV e' medial:    7.83 cm/s LV PW:         1.60 cm   LV E/e' medial:  9.8 LV IVS:        1.50 cm   LV e' lateral:   10.60 cm/s LVOT diam:     2.30 cm   LV E/e' lateral: 7.2 LV SV:         93 LV SV Index:   37 LVOT Area:     4.15 cm  RIGHT VENTRICLE RV S prime:     11.50 cm/s TAPSE (M-mode): 2.3 cm LEFT ATRIUM             Index        RIGHT ATRIUM           Index LA diam:        3.50 cm 1.39 cm/m   RA Area:     17.40 cm LA Vol (A2C):   51.1 ml 20.25 ml/m  RA Volume:   43.60 ml  17.27 ml/m LA Vol (A4C):   39.5 ml 15.65 ml/m LA Biplane Vol: 47.3 ml 18.74 ml/m  AORTIC VALVE LVOT Vmax:   92.90 cm/s LVOT Vmean:  69.300 cm/s LVOT VTI:    0.224 m  AORTA Ao Root diam: 3.30 cm Ao Asc diam:  3.30 cm MITRAL VALVE MV Area (PHT): 4.39 cm    SHUNTS MV Decel Time: 173 msec    Systemic VTI:  0.22 m MV E velocity: 76.50 cm/s  Systemic Diam: 2.30 cm MV A velocity: 58.30 cm/s MV E/A ratio:  1.31 Eleonore Chiquito MD Electronically signed by Eleonore Chiquito MD Signature Date/Time: 10/15/2021/3:52:54 PM    Final     Cardiac Studies   Diagnostic Dominance: Right   Patient Profile     51 y.o. male HTN and tobacco abuse  who is being seen 10/15/2021 for the evaluation of STEMI.  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  Could have had in situ thrombosis, plaque erosion, or perhaps a small sidebranch occlusion that we are not able to identify. Prediabetes: diet and exercise prescribed. Normal lipid status: Agree with statin therapy for pleiotropic effects. Tobacco use: Committed to cessation.  We will discharge today.  Follow-up with Dr. Martinique in 2 weeks.  For questions or updates, please contact [place] Please consult [email or web address] for contact info under        Signed, Sinclair Grooms, MD  10/16/2021, 11:06 AM

## 2021-10-16 NOTE — Discharge Summary (Addendum)
The patient has been seen in conjunction with Vin Bhagat, PAC. All aspects of care have been considered and discussed. The patient has been personally interviewed, examined, and all clinical data has been reviewed.  See earlier note. Had [facility]. Needs [facility]. F/u with Dr. Swaziland.   Discharge Summary    Patient ID: Curtis Marshall MRN: [id number]; DOB: 18-Dec-1970  Admit date: 10/15/2021 Discharge date: 10/16/2021  PCP:  Lysbeth Galas, NP   [facility] Providers Cardiologist:  New to Dr. Swaziland   Discharge Diagnoses    Principal Problem:   Unstable angina [facility])   ACS   HTN  Tobacco abuse  Diagnostic Studies/Procedures    LEFT HEART CATH AND CORONARY ANGIOGRAPHY 6/6/223 Ost LAD to Mid LAD lesion is 20% stenosed.   The left ventricular systolic function is normal.   LV end diastolic pressure is moderately elevated.   The left ventricular ejection fraction is 55-65% by visual estimate.   Mild nonobstructive CAD. No culprit lesion seen  Moderately elevated LVEDP Normal LV function   Plan: etiology of acute syndrome could be vasospasm, resolved thrombus or HTN. Recommend medical management with DAPT, BP control and smoking cessation. Will check Echo.   Echo 10/15/21 1. Left ventricular ejection fraction, by estimation, is 55 to 60%. The  left ventricle has normal function. The left ventricle has no regional  wall motion abnormalities. There is moderate concentric left ventricular  hypertrophy. Left ventricular  diastolic parameters were normal.   2. Right ventricular systolic function is normal. The right ventricular  size is normal. Tricuspid regurgitation signal is inadequate for assessing  PA pressure.   3. The mitral valve is grossly normal. Trivial mitral valve  regurgitation. No evidence of mitral stenosis.   4. The aortic valve is grossly normal. Aortic valve regurgitation is not  visualized. No aortic stenosis is present.   5. The inferior vena cava is  dilated in size with >50% respiratory  variability, suggesting right atrial pressure of 8 mmHg.    History of Present Illness     Curtis Marshall is a 51 y.o. male with history of HTN and tobacco abuse who is being seen for the evaluation of STEMI.  He was recently seen in the ED one week ago and treated for bronchitis with steroids and antibiotics. Today he was driving in his car when he experienced sudden onset  severe mid sternal chest pain radiating to the right arm. He states he through his cigarettes out the window and went to the ED at [facility]. Initial Ecg there showed ST elevation 2 mm in the anterior precordial leads. This was new compared to old Ecg. He was given ASA and IV heparin bolus. Code STEMI was activated and patient transported for emergent cardiac cath. In route his chest pain improved markedly and repeat Ecg showed normalization of ST elevation. No prior cardiac history. No history of DM or HLD. Father did have heart disease.   Hospital Course     Consultants: None   ACS -Resolution of chest pain and normalization of ST elevation upon arrival to [facility].  Emergent cardiac catheterization showed mild nonobstructive disease.  No culprit lesion plan.  Moderately elevated LVEDP.  It was felt the etiology of ACS could be due to vasospasm, resolved thrombus or hypertension.  Recommended medical management with dual antiplatelet therapy.  Troponin peaked at 340. Echocardiogram with preserved LV function.  He will ambulate with cardiac rehab.  He does not have any pleuritic symptoms. -  He is placed on aspirin and Brilinta. -Added statin, Imdur and Toprol-XL -Will need lipid panel and LFTs in 6 to 8 weeks.  2.  Hypertension -Added Toprol-XL (was on propranolol at home). -Will resume home lisinopril  3.  Tobacco abuse -Recommended cessation  Did the patient have an acute coronary syndrome (MI, NSTEMI, STEMI, etc) this admission?:  Yes                                AHA/ACC Clinical Performance & Quality Measures: Aspirin prescribed? - Yes ADP Receptor Inhibitor (Plavix/Clopidogrel, Brilinta/Ticagrelor or Effient/Prasugrel) prescribed (includes medically managed patients)? - Yes Beta Blocker prescribed? - Yes High Intensity Statin (Lipitor 40-80mg  or Crestor 20-40mg ) prescribed? - Yes EF assessed during THIS hospitalization? - Yes For EF <40%, was ACEI/ARB prescribed? - Yes For EF <40%, Aldosterone Antagonist (Spironolactone or Eplerenone) prescribed? - Not Applicable (EF >/= 40%) Cardiac Rehab Phase II ordered (including medically managed patients)? - Yes   The patient will be scheduled for a TOC follow up appointment in 2 weeks.  A message has been sent to the [facility] and Scheduling Pool at the office where the patient should be seen for follow up.    Discharge Vitals Blood pressure 137/77, pulse (!) 59, temperature 97.7 F (36.5 C), temperature source Oral, resp. rate 18, height 6\' 2"  (1.88 m), weight 122.9 kg, SpO2 96 %.  Filed Weights   10/16/21 0747  Weight: 122.9 kg   Physical Exam HENT:     Head: Normocephalic.  Eyes:     Pupils: Pupils are equal, round, and reactive to light.  Cardiovascular:     Rate and Rhythm: Normal rate and regular rhythm.     Comments: Radial cath site without hematoma  Pulmonary:     Effort: Pulmonary effort is normal.     Breath sounds: Normal breath sounds.  Abdominal:     General: Abdomen is flat.     Palpations: Abdomen is soft.  Musculoskeletal:        General: Normal range of motion.     Cervical back: Normal range of motion.  Skin:    General: Skin is warm.  Neurological:     General: No focal deficit present.     Mental Status: He is alert and oriented to person, place, and time.  Psychiatric:        Mood and Affect: Mood normal.        Behavior: Behavior normal.    Labs & Radiologic Studies    CBC Recent Labs    10/15/21 1143 10/16/21 0928  WBC 10.7* 13.2*  NEUTROABS 6.9  --    HGB 14.4 13.9  HCT 40.8 39.1  MCV 94.9 96.5  PLT 349 306   Basic Metabolic Panel Recent Labs    12/16/21 1143 10/16/21 0928  NA 140 135  K 3.2* 3.8  CL 105 104  CO2 24 27  GLUCOSE 157* 101*  BUN 13 14  CREATININE 0.91 0.76  CALCIUM 8.9 9.1   Liver Function Tests Recent Labs    10/15/21 1143  AST 16  ALT 20  ALKPHOS 56  BILITOT 0.5  PROT 6.2*  ALBUMIN 3.6    High Sensitivity Troponin:   Recent Labs  Lab 10/15/21 1143 10/15/21 1559 10/15/21 1829  TROPONINIHS 49* 340* 299*     Hemoglobin A1C Recent Labs    10/15/21 1143  HGBA1C 5.8*  5.8*   Fasting Lipid Panel  Recent Labs    10/16/21 0228  CHOL 132  HDL 30*  LDLCALC 74  TRIG 948  CHOLHDL 4.4   _____________  CARDIAC CATHETERIZATION  Result Date: 10/15/2021   Ost LAD to Mid LAD lesion is 20% stenosed.   The left ventricular systolic function is normal.   LV end diastolic pressure is moderately elevated.   The left ventricular ejection fraction is 55-65% by visual estimate. Mild nonobstructive CAD. No culprit lesion seen Moderately elevated LVEDP Normal LV function Plan: etiology of acute syndrome could be vasospasm, resolved thrombus or HTN. Recommend medical management with DAPT, BP control and smoking cessation. Will check Echo.   ECHOCARDIOGRAM COMPLETE  Result Date: 10/15/2021    ECHOCARDIOGRAM REPORT   Patient Name:   Curtis Marshall Date of Exam: 10/15/2021 Medical Rec #:  [id number]        Height:       75.0 in Accession #:    [id number]       Weight:       278.0 lb Date of Birth:  11/04/1970        BSA:          2.524 m Patient Age:    51 years         BP:           144/88 mmHg Patient Gender: M                HR:           64 bpm. Exam Location:  Inpatient Procedure: 2D Echo, Color Doppler and Cardiac Doppler Indications:    CAD Native Vessel i25.10  History:        Patient has no prior history of Echocardiogram examinations.                 Risk Factors:Hypertension and Current Smoker.  Sonographer:     Irving Burton Senior RDCS Referring Phys: [phone] PETER M Sw[place]and  Sonographer Comments: Technically difficult due to lung interference IMPRESSIONS  1. Left ventricular ejection fraction, by estimation, is 55 to 60%. The left ventricle has normal function. The left ventricle has no regional wall motion abnormalities. There is moderate concentric left ventricular hypertrophy. Left ventricular diastolic parameters were normal.  2. Right ventricular systolic function is normal. The right ventricular size is normal. Tricuspid regurgitation signal is inadequate for assessing PA pressure.  3. The mitral valve is grossly normal. Trivial mitral valve regurgitation. No evidence of mitral stenosis.  4. The aortic valve is grossly normal. Aortic valve regurgitation is not visualized. No aortic stenosis is present.  5. The inferior vena cava is dilated in size with >50% respiratory variability, suggesting right atrial pressure of 8 mmHg. FINDINGS  Left Ventricle: Left ventricular ejection fraction, by estimation, is 55 to 60%. The left ventricle has normal function. The left ventricle has no regional wall motion abnormalities. The left ventricular internal cavity size was normal in size. There is  moderate concentric left ventricular hypertrophy. Left ventricular diastolic parameters were normal. Right Ventricle: The right ventricular size is normal. No increase in right ventricular wall thickness. Right ventricular systolic function is normal. Tricuspid regurgitation signal is inadequate for assessing PA pressure. Left Atrium: Left atrial size was normal in size. Right Atrium: Right atrial size was normal in size. Pericardium: There is no evidence of pericardial effusion. Mitral Valve: The mitral valve is grossly normal. Trivial mitral valve regurgitation. No evidence of mitral valve stenosis. Tricuspid Valve: The tricuspid valve is grossly normal.  Tricuspid valve regurgitation is trivial. No evidence of tricuspid stenosis. Aortic  Valve: The aortic valve is grossly normal. Aortic valve regurgitation is not visualized. No aortic stenosis is present. Pulmonic Valve: The pulmonic valve was grossly normal. Pulmonic valve regurgitation is not visualized. No evidence of pulmonic stenosis. Aorta: The aortic root and ascending aorta are structurally normal, with no evidence of dilitation. Venous: The inferior vena cava is dilated in size with greater than 50% respiratory variability, suggesting right atrial pressure of 8 mmHg. IAS/Shunts: The atrial septum is grossly normal.  LEFT VENTRICLE PLAX 2D LVIDd:         3.35 cm   Diastology LVIDs:         2.10 cm   LV e' medial:    7.83 cm/s LV PW:         1.60 cm   LV E/e' medial:  9.8 LV IVS:        1.50 cm   LV e' lateral:   10.60 cm/s LVOT diam:     2.30 cm   LV E/e' lateral: 7.2 LV SV:         93 LV SV Index:   37 LVOT Area:     4.15 cm  RIGHT VENTRICLE RV S prime:     11.50 cm/s TAPSE (M-mode): 2.3 cm LEFT ATRIUM             Index        RIGHT ATRIUM           Index LA diam:        3.50 cm 1.39 cm/m   RA Area:     17.40 cm LA Vol (A2C):   51.1 ml 20.25 ml/m  RA Volume:   43.60 ml  17.27 ml/m LA Vol (A4C):   39.5 ml 15.65 ml/m LA Biplane Vol: 47.3 ml 18.74 ml/m  AORTIC VALVE LVOT Vmax:   92.90 cm/s LVOT Vmean:  69.300 cm/s LVOT VTI:    0.224 m  AORTA Ao Root diam: 3.30 cm Ao Asc diam:  3.30 cm MITRAL VALVE MV Area (PHT): 4.39 cm    SHUNTS MV Decel Time: 173 msec    Systemic VTI:  0.22 m MV E velocity: 76.50 cm/s  Systemic Diam: 2.30 cm MV A velocity: 58.30 cm/s MV E/A ratio:  1.31 Lennie Odor MD Electronically signed by Lennie Odor MD Signature Date/Time: 10/15/2021/3:52:54 PM    Final     Disposition   Pt is being discharged home today in good condition.  Follow-up Plans & Appointments     Follow-up Information     Winkelman, Sharrell Ku, [place] Follow up on 11/01/2021.   Specialty: Cardiology Why: :55am for hospital follow up. Please arrive 15 minutes early Contact  information: [address] [place] [address] [phone]                Discharge Instructions     Amb Referral to Cardiac Rehabilitation   Complete by: As directed    Diagnosis: STEMI   After initial evaluation and assessments completed: Virtual Based Care may be provided alone or in conjunction with Phase 2 Cardiac Rehab based on patient barriers.: Yes   Diet - low sodium heart healthy   Complete by: As directed    Discharge instructions   Complete by: As directed    NO HEAVY LIFTING (>10lbs) X 2 WEEKS. NO SEXUAL ACTIVITY X 2 WEEKS. NO DRIVING X 3 DAYS. NO SOAKING BATHS, HOT TUBS, POOLS, ETC., X  7 DAYS.  RETURN TO WORK 10/21/21.   Increase activity slowly   Complete by: As directed        Discharge Medications   Allergies as of 10/16/2021       Reactions   Pepto-bismol [bismuth Subsalicylate] Nausea And Vomiting        Medication List     STOP taking these medications    propranolol ER 60 MG 24 hr capsule Commonly known as: INDERAL LA       TAKE these medications    aspirin EC 81 MG tablet Take 1 tablet (81 mg total) by mouth daily. Swallow whole. Start taking on: October 17, 2021   atorvastatin 40 MG tablet Commonly known as: LIPITOR Take 1 tablet (40 mg total) by mouth daily. Start taking on: October 17, 2021   isosorbide mononitrate 30 MG 24 hr tablet Commonly known as: IMDUR Take 1 tablet (30 mg total) by mouth daily. Start taking on: October 17, 2021   lisinopril 40 MG tablet Commonly known as: ZESTRIL Take 40 mg by mouth daily.   meclizine 25 MG tablet Commonly known as: ANTIVERT Take 25 mg by mouth 3 (three) times daily as needed for dizziness.   metoprolol succinate 25 MG 24 hr tablet Commonly known as: TOPROL-XL Take 1 tablet (25 mg total) by mouth daily. Start taking on: October 17, 2021   nitroGLYCERIN 0.4 MG SL tablet Commonly known as: NITROSTAT Place 1 tablet (0.4 mg total) under the tongue every 5 (five) minutes x 3  doses as needed for chest pain.   rizatriptan 10 MG tablet Commonly known as: MAXALT Take 10 mg by mouth daily as needed for migraine.   ticagrelor 90 MG Tabs tablet Commonly known as: BRILINTA Take 1 tablet (90 mg total) by mouth 2 (two) times daily.   varenicline 0.5 MG X 11 & 1 MG X 42 tablet Commonly known as: CHANTIX PAK Take by mouth.   Ventolin HFA 108 (90 Base) MCG/ACT inhaler Generic drug: albuterol Inhale 2 puffs into the lungs every 6 (six) hours as needed for wheezing or shortness of breath.           Outstanding Labs/Studies   Consider OP f/u labs 6-8 weeks given statin initiation this admission.   Duration of Discharge Encounter   Greater than 30 minutes including physician time.  Lorelei Pont, PA 10/16/2021, 11:50 AM

## 2021-10-16 NOTE — Progress Notes (Addendum)
CARDIAC REHAB PHASE I   PRE:  Rate/Rhythm: 70 NSR  BP:  Sitting: 119/79      SaO2: 97 RA  MODE:  Ambulation: 470 ft   POST:  Rate/Rhythm: 87 NSR  BP:  Sitting: 139/76      SaO2: 97 RA  Seen pt from [id number] pt was ambulated through hallways with no assistance. Pt walked with limp (known) and did not complain of chest pain, SOB, or dizziness during ambulation. Pt was returned to room and educated on MI, Aspirin & Birlinta functions, ex guidelines, NTG use, diet, smoking cessation, risk factors, and CRPII. Pt is being referred to CRPII in A.    Curtis Marshall  8:51 AM 10/16/2021

## 2021-10-16 NOTE — TOC Benefit Eligibility Note (Signed)
Patient Product/process development scientist completed.    The patient is currently admitted and upon discharge could be taking Brilinta 90 mg.  The current 30 day co-pay is, $4.00.   The patient is insured through [facility]     Roland Earl, CPhT Pharmacy Patient Advocate Specialist [facility] Patient Advocate Team Direct Number: [phone]  Fax: [phone]

## 2021-10-17 ENCOUNTER — Other Ambulatory Visit (HOSPITAL_COMMUNITY): Payer: Self-pay

## 2021-10-17 LAB — LIPOPROTEIN A (LPA): Lipoprotein (a): 9.6 nmol/L (ref <75.0)

## 2021-10-21 ENCOUNTER — Other Ambulatory Visit (HOSPITAL_COMMUNITY): Payer: Self-pay

## 2021-10-31 ENCOUNTER — Encounter: Payer: Self-pay | Admitting: Physician Assistant

## 2021-11-01 ENCOUNTER — Telehealth (HOSPITAL_COMMUNITY): Payer: Self-pay

## 2021-11-01 ENCOUNTER — Other Ambulatory Visit: Payer: Self-pay | Admitting: Physician Assistant

## 2021-11-01 ENCOUNTER — Encounter: Payer: Self-pay | Admitting: Physician Assistant

## 2021-11-01 ENCOUNTER — Ambulatory Visit (HOSPITAL_BASED_OUTPATIENT_CLINIC_OR_DEPARTMENT_OTHER)
Admission: RE | Admit: 2021-11-01 | Discharge: 2021-11-01 | Disposition: A | Discharge status: Home / Self Care | Payer: Medicaid Other | Source: Ambulatory Visit | Attending: Physician Assistant | Admitting: Physician Assistant

## 2021-11-01 ENCOUNTER — Ambulatory Visit (HOSPITAL_BASED_OUTPATIENT_CLINIC_OR_DEPARTMENT_OTHER)
Admission: RE | Admit: 2021-11-01 | Discharge: 2021-11-01 | Disposition: A | Discharge status: Home / Self Care | Payer: Medicaid Other | Source: Ambulatory Visit

## 2021-11-01 ENCOUNTER — Other Ambulatory Visit (HOSPITAL_COMMUNITY): Payer: Self-pay

## 2021-11-01 ENCOUNTER — Ambulatory Visit (INDEPENDENT_AMBULATORY_CARE_PROVIDER_SITE_OTHER): Payer: Medicaid Other | Admitting: Physician Assistant

## 2021-11-01 VITALS — BP 130/86 | HR 75 | Ht 74.0 in | Wt 277.0 lb

## 2021-11-01 DIAGNOSIS — I200 Unstable angina: Secondary | ICD-10-CM

## 2021-11-01 DIAGNOSIS — R531 Weakness: Secondary | ICD-10-CM | POA: Insufficient documentation

## 2021-11-01 DIAGNOSIS — R4781 Slurred speech: Secondary | ICD-10-CM

## 2021-11-01 DIAGNOSIS — I10 Essential (primary) hypertension: Secondary | ICD-10-CM | POA: Diagnosis not present

## 2021-11-01 IMAGING — MR MR MRA HEAD W/O CM
3 series · 37 of 48 positions shown · IV contrast (gadavist)
Comparison: CT head [DATE]

CLINICAL DATA: Acute neuro deficit.  Speech difficulty

EXAM:
MRA NECK WITHOUT AND WITH CONTRAST
MRA HEAD WITHOUT AND WITH CONTRAST
TECHNIQUE: Multiplanar and multiecho pulse sequences of the neck were obtained
without and with intravenous contrast. Angiographic images of the
neck were obtained using MRA technique without and with intravenous
contast.; Angiographic images of the Circle of Willis were obtained
using MRA technique without and with intravenous contrast.
CONTRAST:  10mL GADAVIST GADOBUTROL 1 MMOL/ML IV SOLN

[Series 3: tof_3d_multi-slab · axial · 0.5mm · 0.39mm/px · z∈[-75,+22]mm · 16 of 206 slices shown]
[im 1/206]
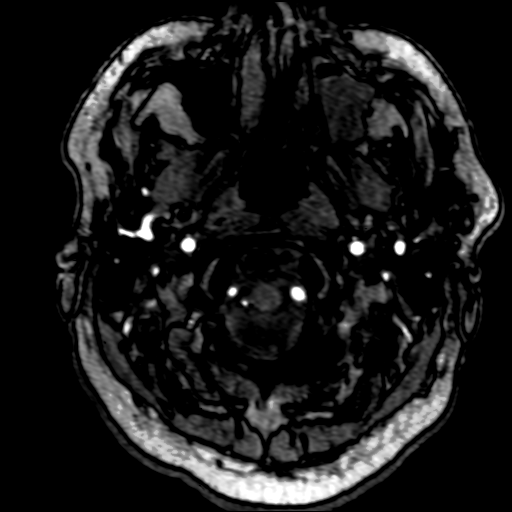
[im 8/206]
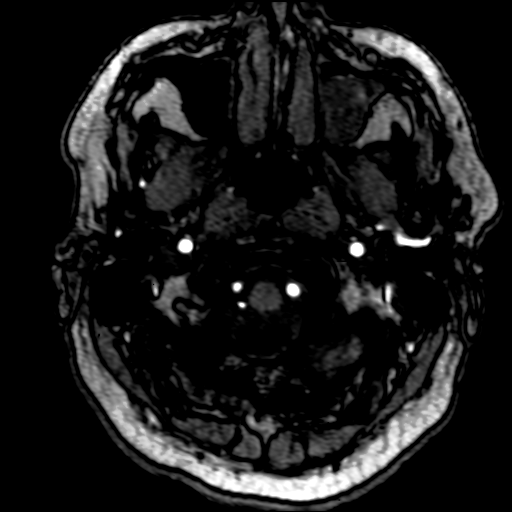
[im 16/206]
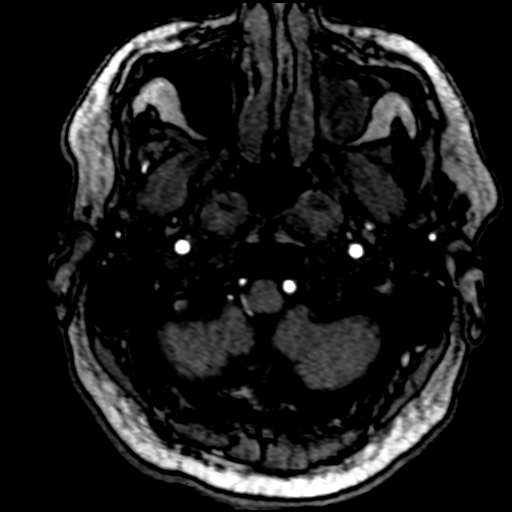
[im 24/206]
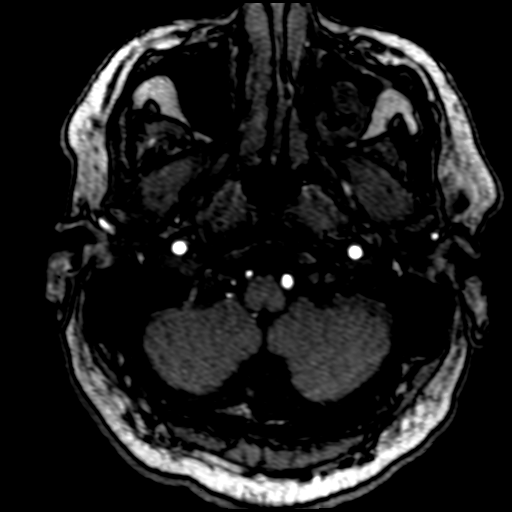
[im 32/206]
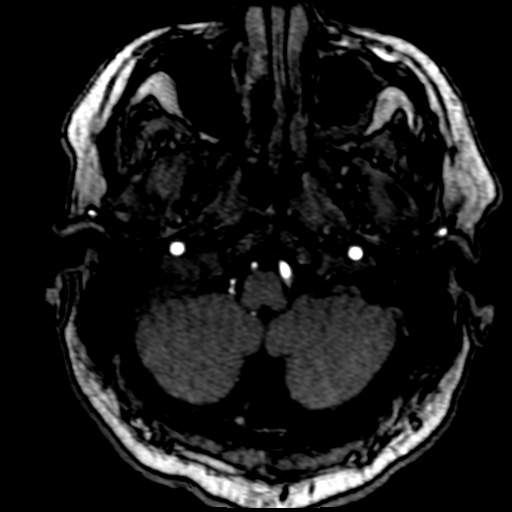
[im 40/206]
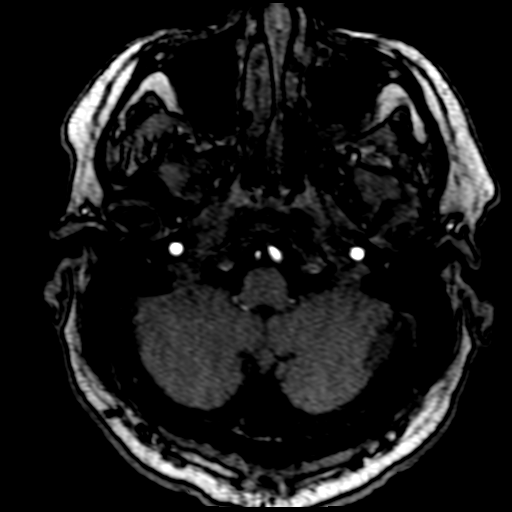
[im 48/206]
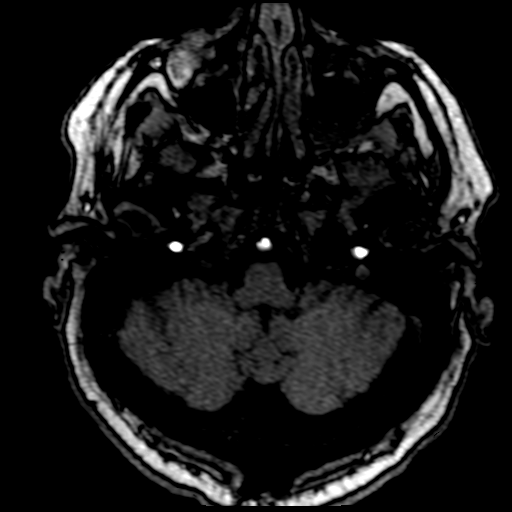
[im 56/206]
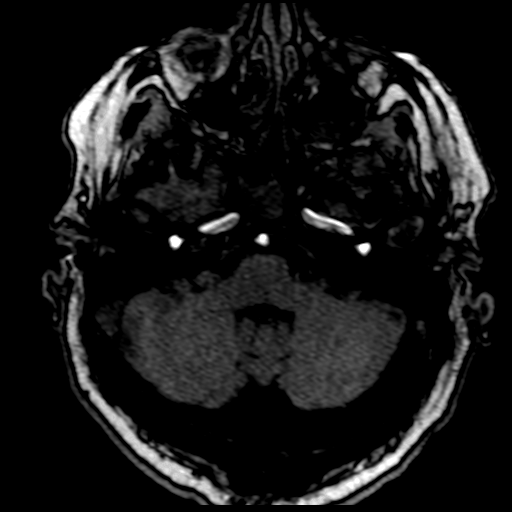
[im 64/206]
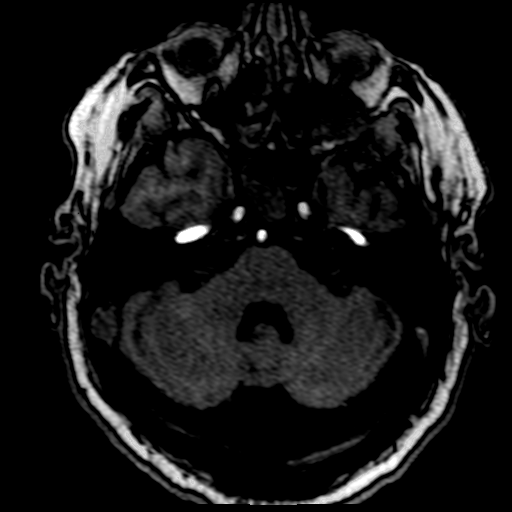
[im 87/206]
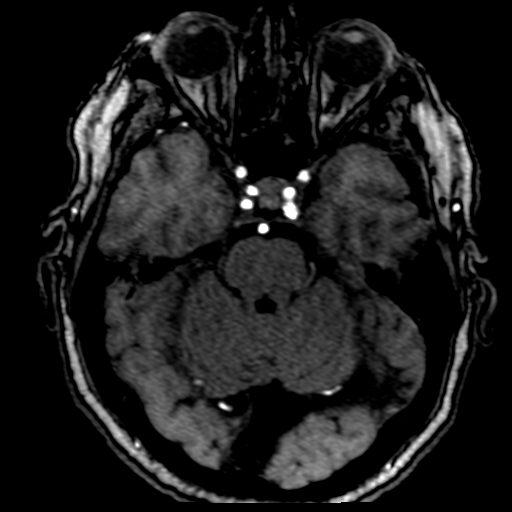
[im 103/206]
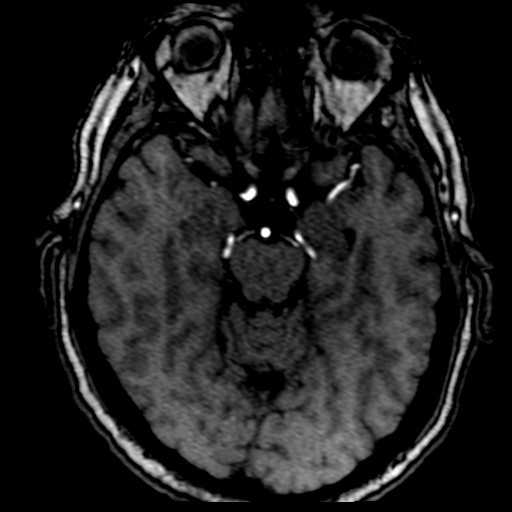
[im 119/206]
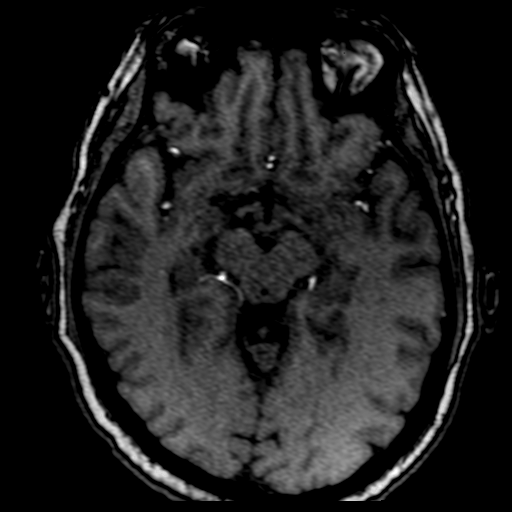
[im 142/206]
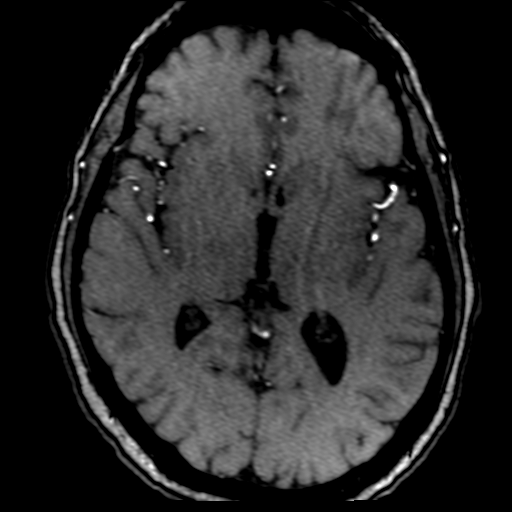
[im 166/206]
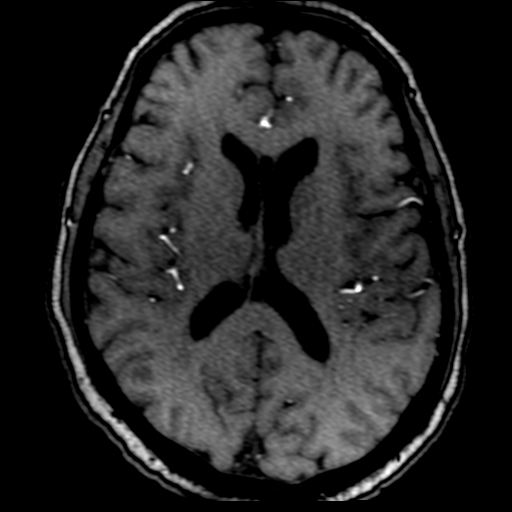
[im 174/206]
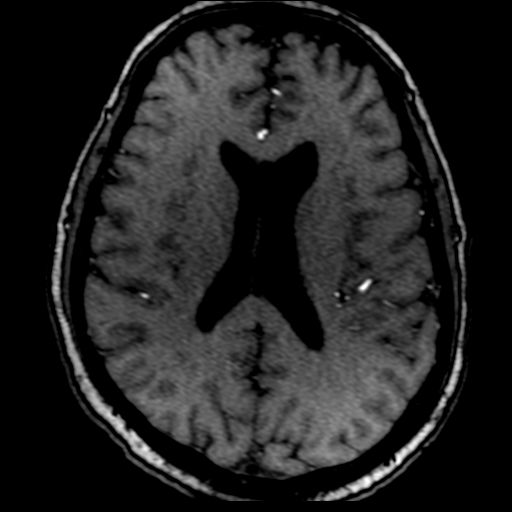
[im 198/206]
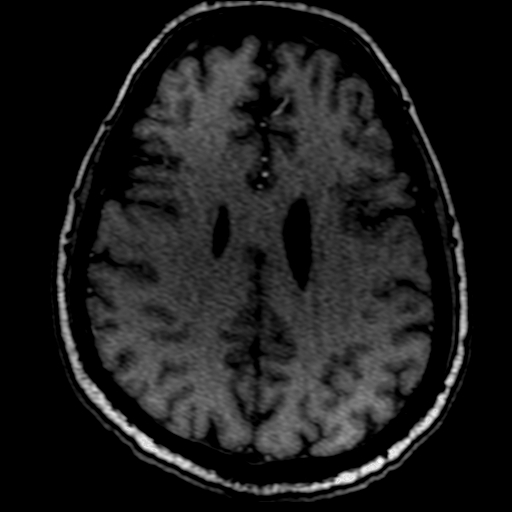

[Series 7: DWI · axial · 3.0mm · 1.30mm/px · z∈[-77,+85]mm · 14 of 112 slices shown (1 of 2)]
[im 1/112]
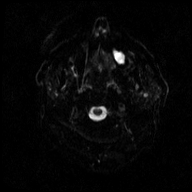
[im 9/112]
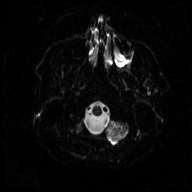
[im 18/112]
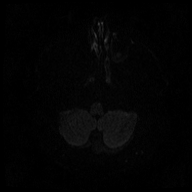
[im 26/112]
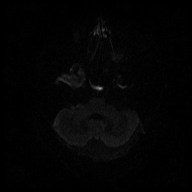
[im 35/112]
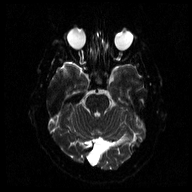
[im 43/112]
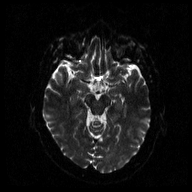
[im 52/112]
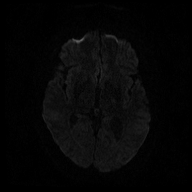
[im 60/112]
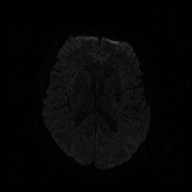
[im 69/112]
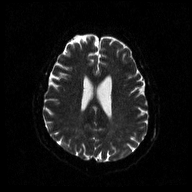
[im 77/112]
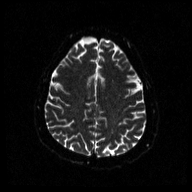
[im 86/112]
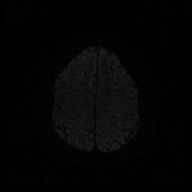
[im 94/112]
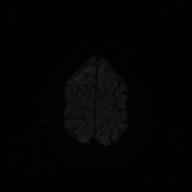
[im 103/112]
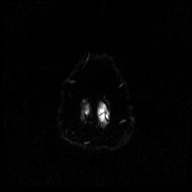
[im 112/112]
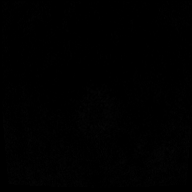

[Series 8: DWI · axial · 3.0mm · 1.30mm/px · z∈[-77,+85]mm · 7 of 56 slices shown (2 of 2)]
[im 1/56]
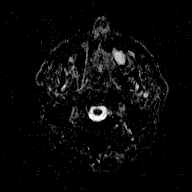
[im 10/56]
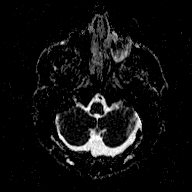
[im 19/56]
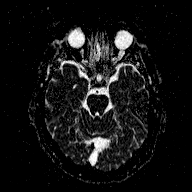
[im 28/56]
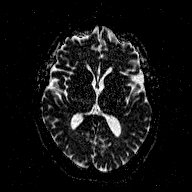
[im 37/56]
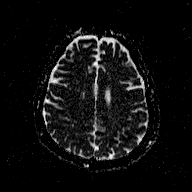
[im 46/56]
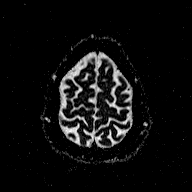
[im 56/56]
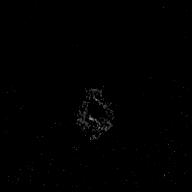

[37 of 48 positions shown; findings below may reference images not displayed]

FINDINGS: MRA NECK FINDINGS

Diffusion-weighted imaging negative for acute infarct

MRA head demonstrates no intracranial stenosis or large vessel
occlusion. No aneurysm identified.

MRA HEAD FINDINGS

Normal aortic arch and proximal great vessels

Carotid and vertebral arteries widely patent bilaterally with
antegrade flow. No carotid or vertebral stenosis or dissection.
IMPRESSION: Negative MRA head

Negative MRA neck

Diffusion-weighted imaging reveals no acute infarct.

## 2021-11-01 IMAGING — MR MR MRA NECK WO/W CM
10 of 11 series · 42 of 48 positions shown · IV contrast (GADAVIST)
Comparison: CT head [DATE]

CLINICAL DATA: Acute neuro deficit.  Speech difficulty

EXAM:
MRA NECK WITHOUT AND WITH CONTRAST
MRA HEAD WITHOUT AND WITH CONTRAST
TECHNIQUE: Multiplanar and multiecho pulse sequences of the neck were obtained
without and with intravenous contrast. Angiographic images of the
neck were obtained using MRA technique without and with intravenous
contast.; Angiographic images of the Circle of Willis were obtained
using MRA technique without and with intravenous contrast.
CONTRAST:  10mL GADAVIST GADOBUTROL 1 MMOL/ML IV SOLN

[Series 7: TOF · axial · 4.0mm · 0.86mm/px · z∈[-308,-48]mm · 10 of 98 slices shown]
[im 1/98]
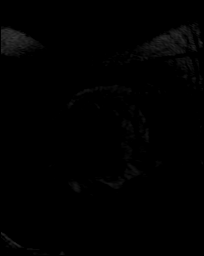
[im 11/98]
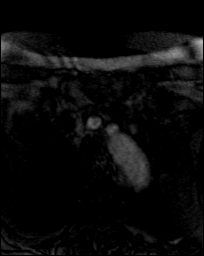
[im 22/98]
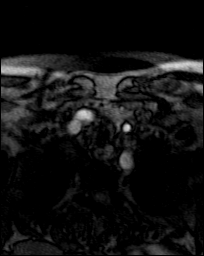
[im 33/98]
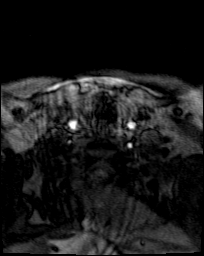
[im 44/98]
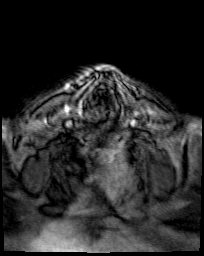
[im 54/98]
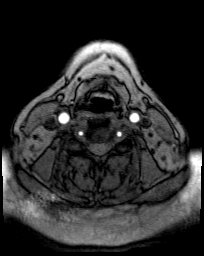
[im 65/98]
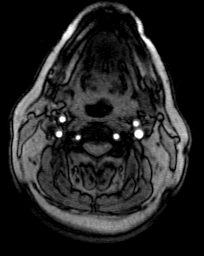
[im 76/98]
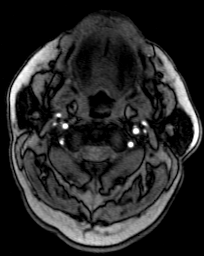
[im 87/98]
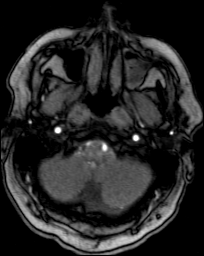
[im 98/98]
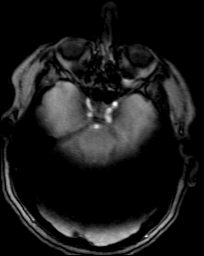

[Series 10: 2d tof_mip_tra · axial · 264.0mm · 0.86mm/px · 1 of 1 slices shown]
[im 1/1]
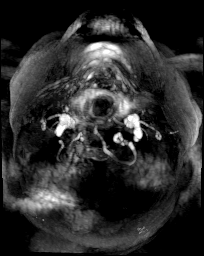

[Series 11: (id)_pre · coronal · 0.7mm · 1.12mm/px · 9 of 88 slices shown]
[im 1/88]
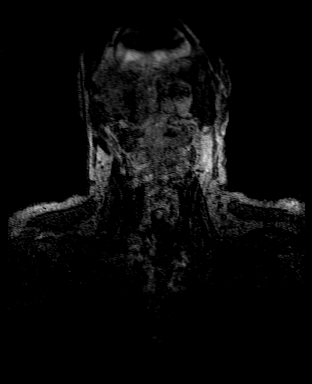
[im 11/88]
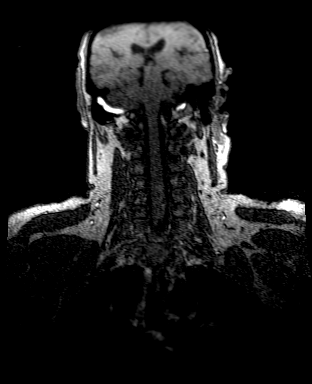
[im 22/88]
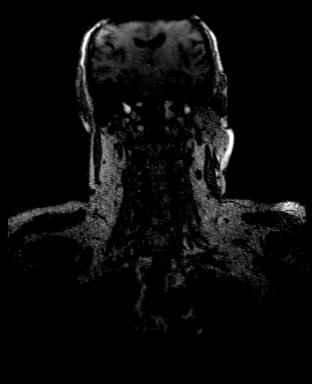
[im 33/88]
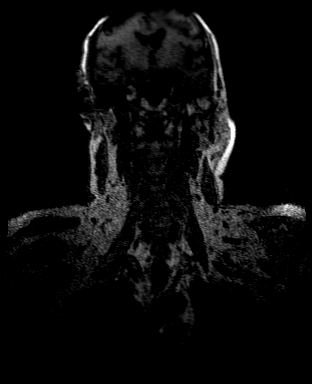
[im 44/88]
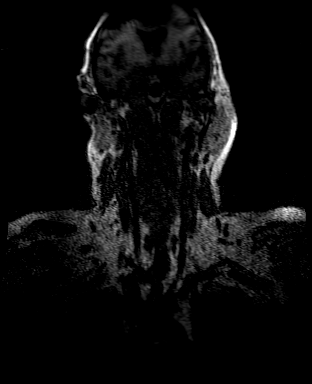
[im 55/88]
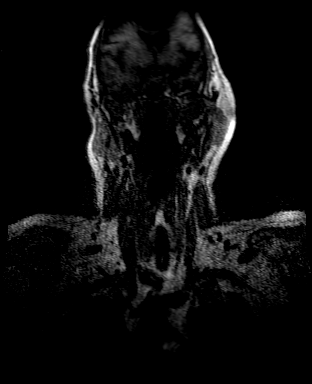
[im 66/88]
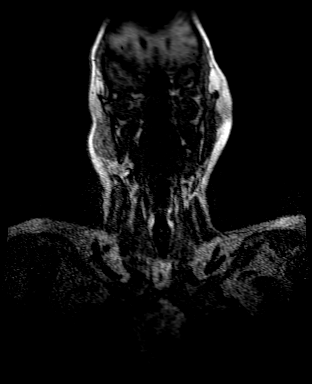
[im 77/88]
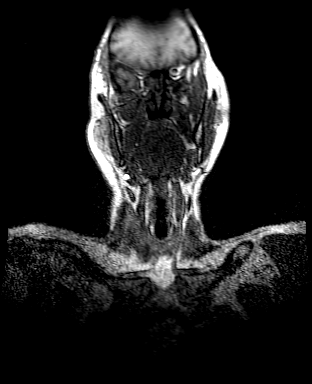
[im 88/88]
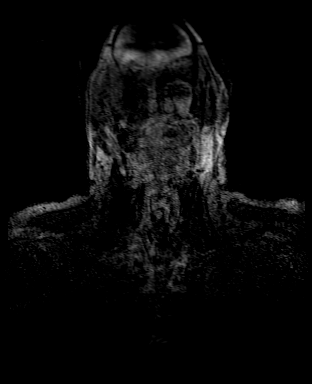

[Series 13: (id)_post · coronal · 0.7mm · 1.12mm/px · 9 of 88 slices shown]
[im 1/88]
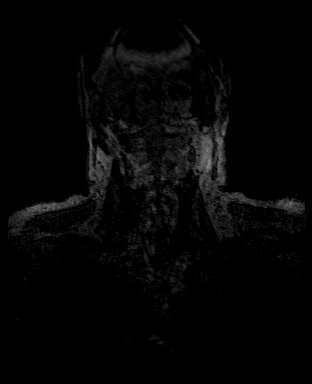
[im 11/88]
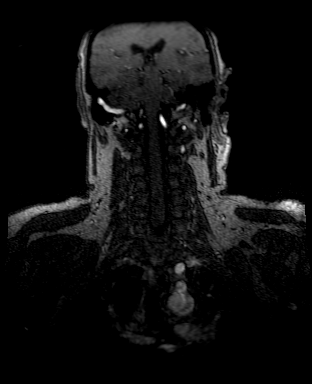
[im 22/88]
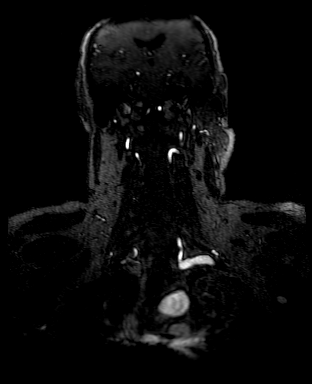
[im 33/88]
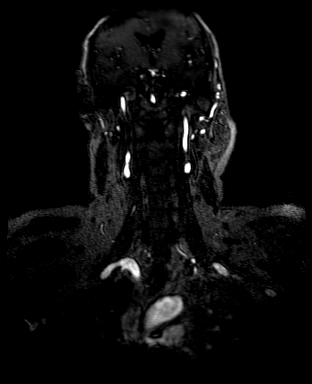
[im 44/88]
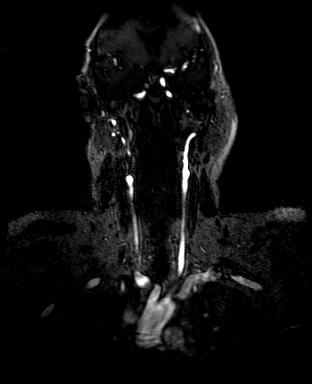
[im 55/88]
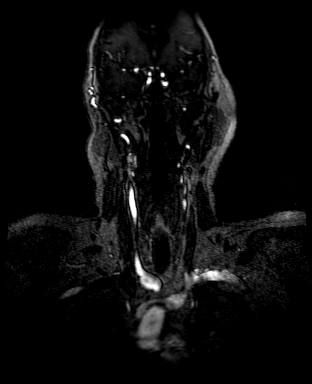
[im 66/88]
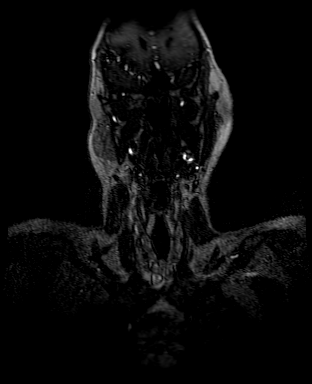
[im 77/88]
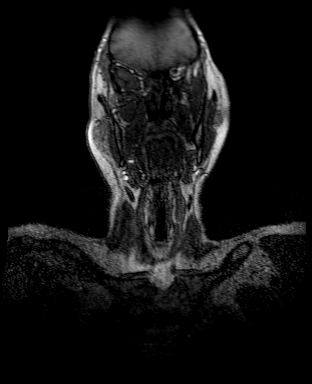
[im 88/88]
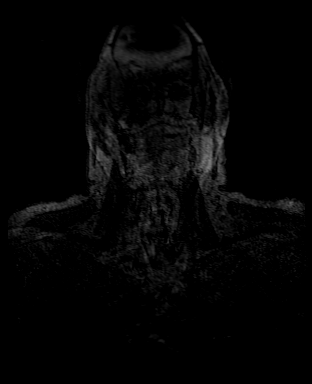

[Series 14: (id)_post_sub · coronal · 0.7mm · 1.12mm/px · 8 of 80 slices shown]
[im 1/80]
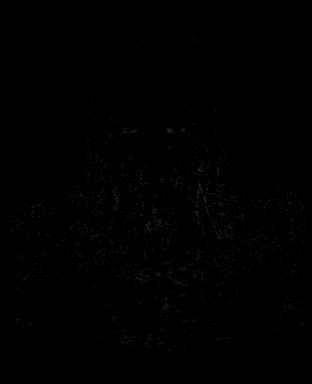
[im 12/80]
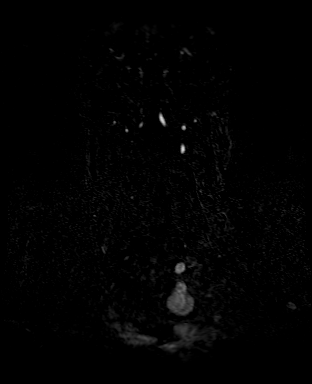
[im 23/80]
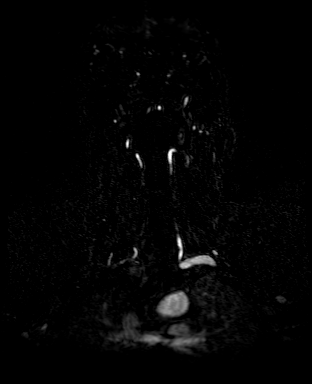
[im 34/80]
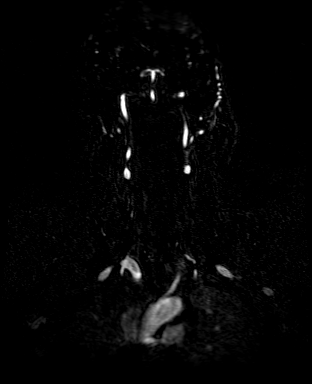
[im 46/80]
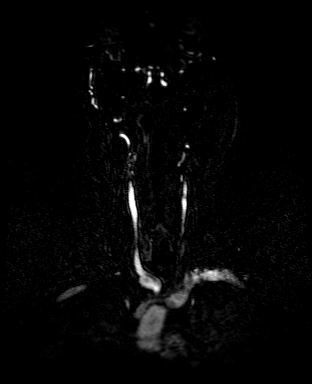
[im 57/80]
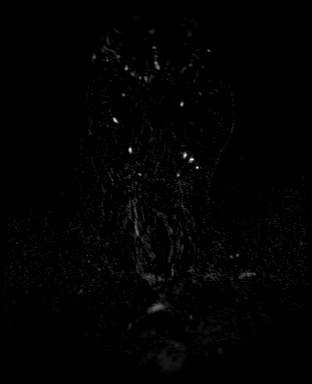
[im 68/80]
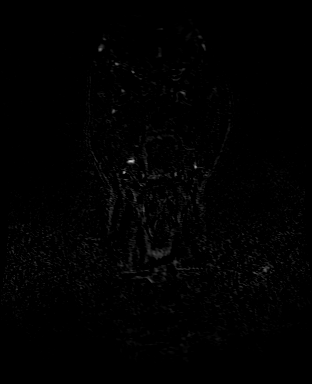
[im 80/80]
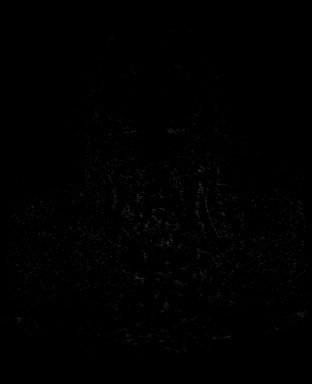

[Series 102: carotids h-f · axial · 1.12mm/px · 1 of 1 slices shown]
[im 1/1]
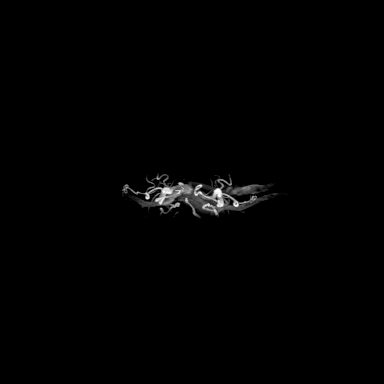

[Series 103: carotids r-l · axial · 0.89mm/px · 1 of 1 slices shown]
[im 1/1]
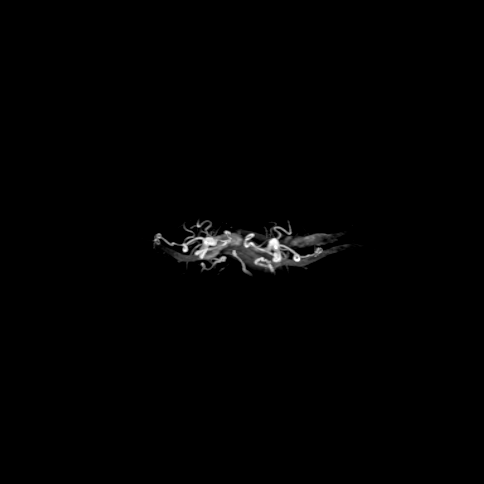

[Series 104: vertebrals h-f · axial · 1.12mm/px · 1 of 2 slices shown]
[im 1/2]
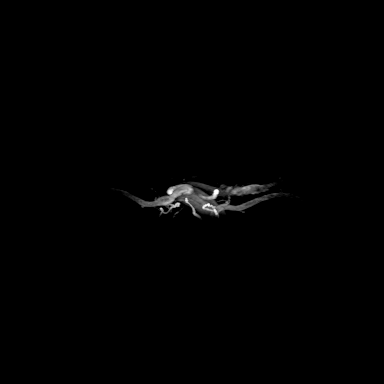

[Series 105: vertebrals r-l · axial · 0.89mm/px · 1 of 1 slices shown]
[im 1/1]
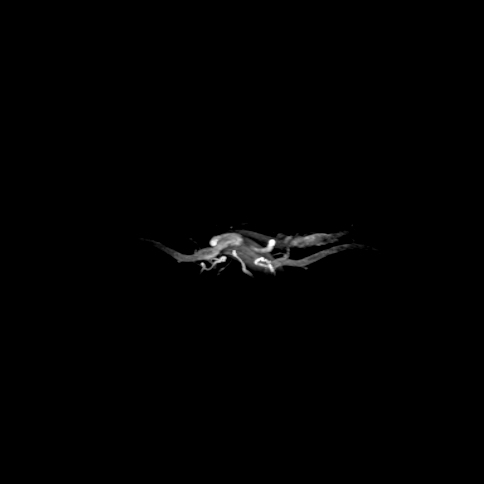

[Series 108: lcca · axial · 0.89mm/px · 1 of 1 slices shown]
[im 1/1]
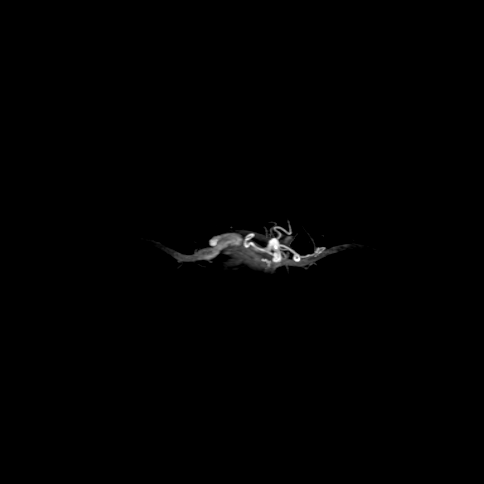

[42 of 48 positions shown; findings below may reference images not displayed]

FINDINGS: MRA NECK FINDINGS

Diffusion-weighted imaging negative for acute infarct

MRA head demonstrates no intracranial stenosis or large vessel
occlusion. No aneurysm identified.

MRA HEAD FINDINGS

Normal aortic arch and proximal great vessels

Carotid and vertebral arteries widely patent bilaterally with
antegrade flow. No carotid or vertebral stenosis or dissection.
IMPRESSION: Negative MRA head

Negative MRA neck

Diffusion-weighted imaging reveals no acute infarct.

## 2021-11-01 IMAGING — CT CT HEAD W/O CM
4 series · 16 of 47 positions shown, 18 images · non-contrast
Comparison: No pertinent prior exams available for comparison.

CLINICAL DATA: Provided history: Deficit in communication due to
slurred speech. Right-sided weakness. Transient ischemic attack.



[Series 2: head wo · axial · 0.45mm/px · z∈[+981,+1106]mm · 7 of 35 slices shown, 9 images]
[im 5/35  brain]
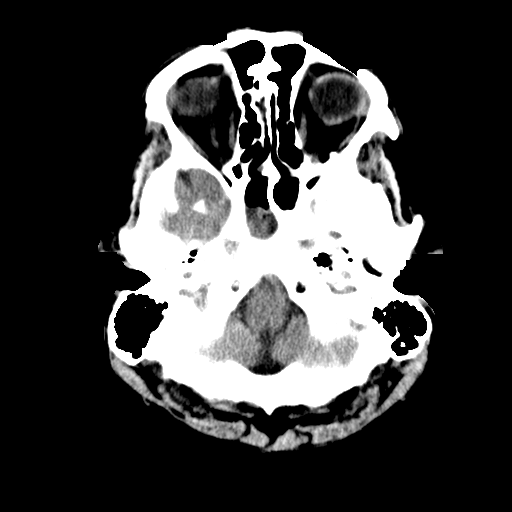
[im 5/35  bone]
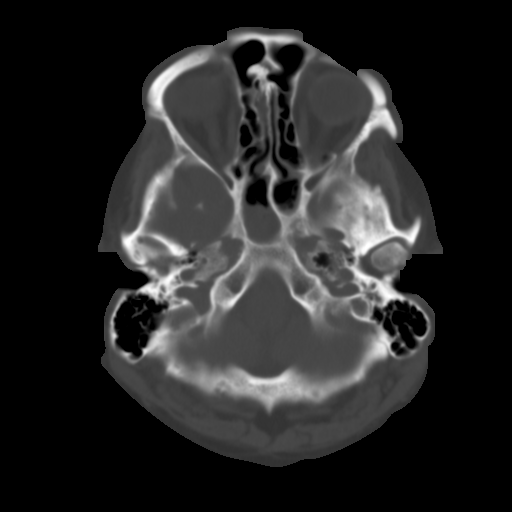
[im 9/35  brain]
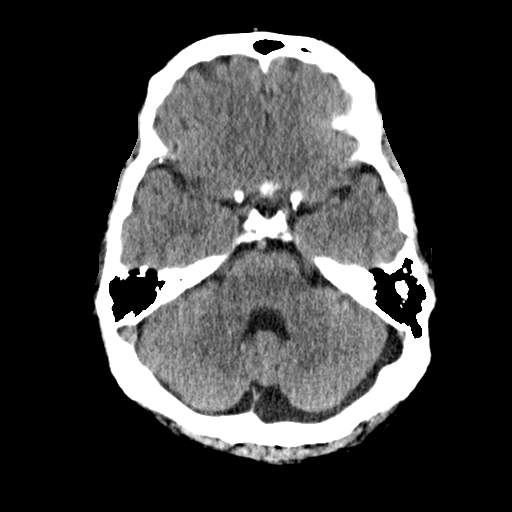
[im 13/35  brain]
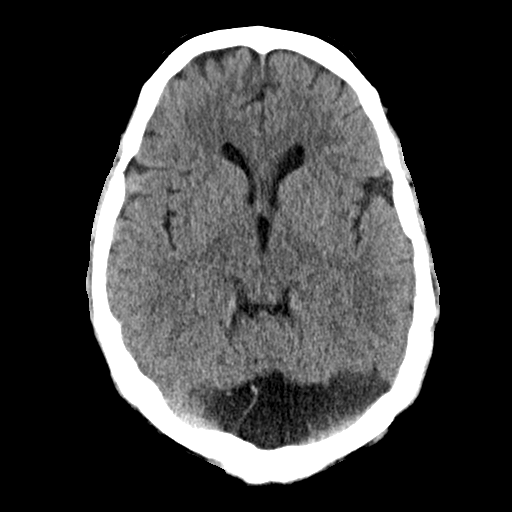
[im 18/35  brain]
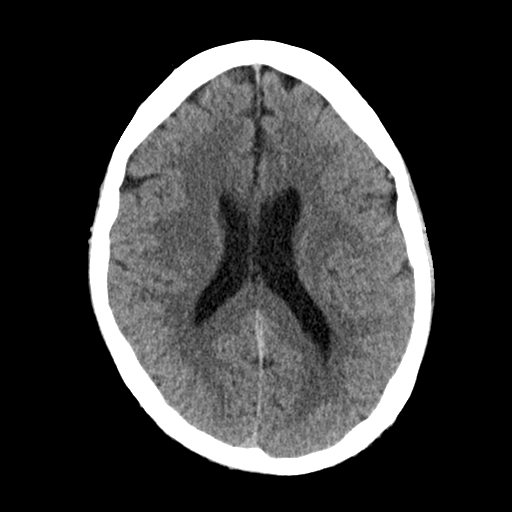
[im 22/35  brain]
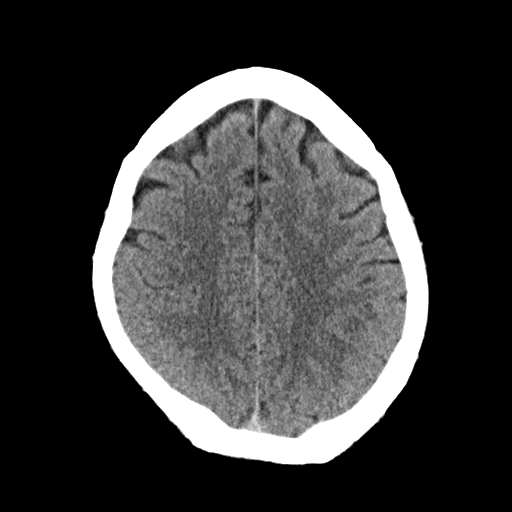
[im 22/35  bone]
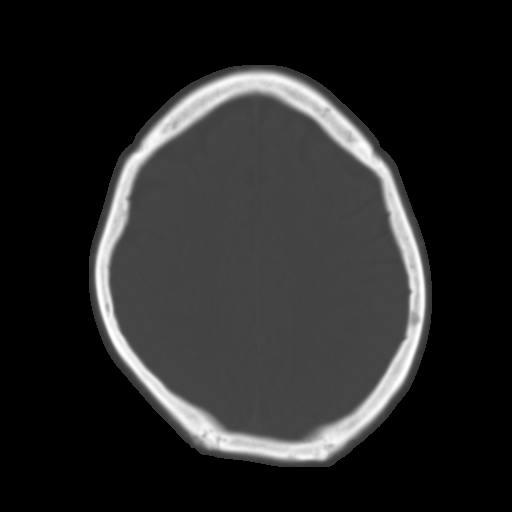
[im 26/35  brain]
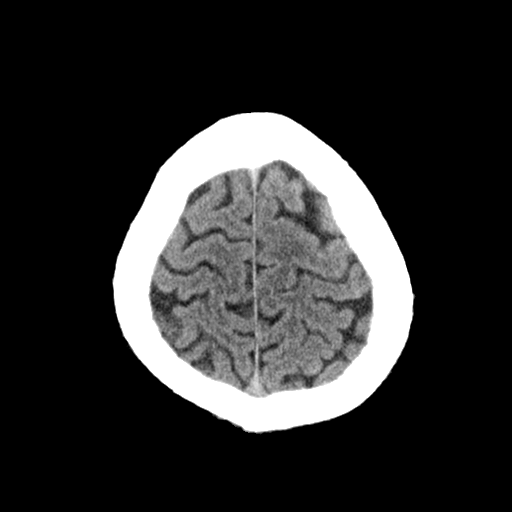
[im 30/35  brain]
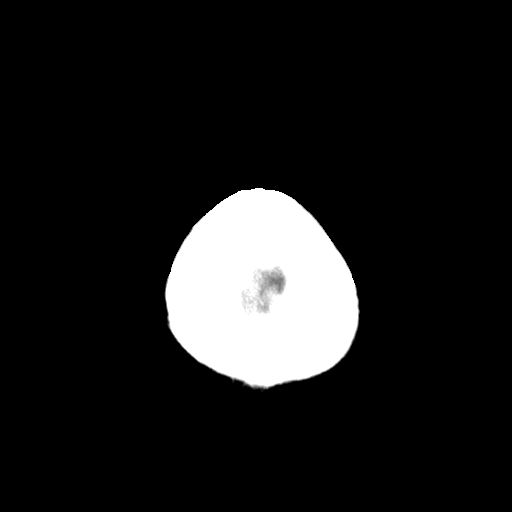

[Series 3: head bone · axial · 0.45mm/px · z∈[+977,+1011]mm · 3 of 86 slices shown]
[im 9/86  bone]
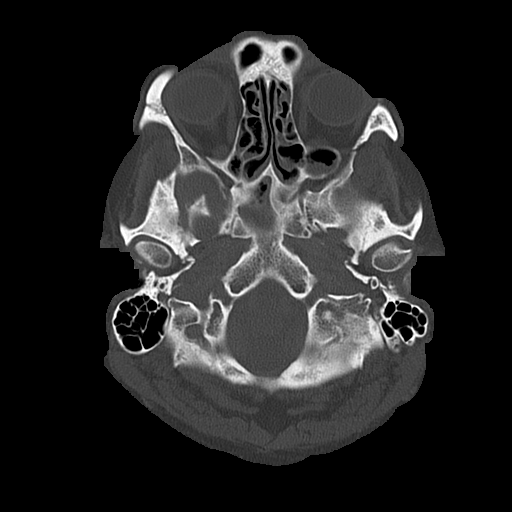
[im 18/86  bone]
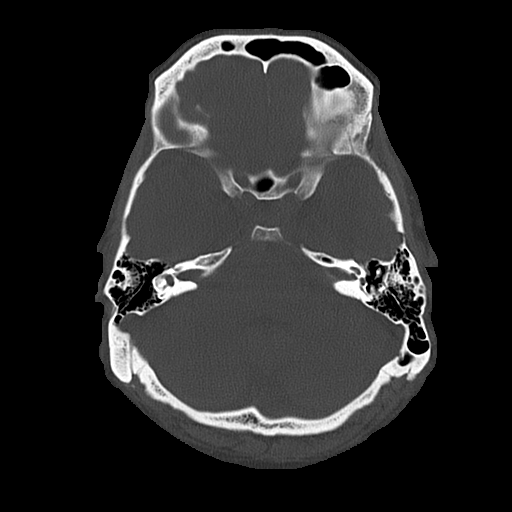
[im 26/86  bone]
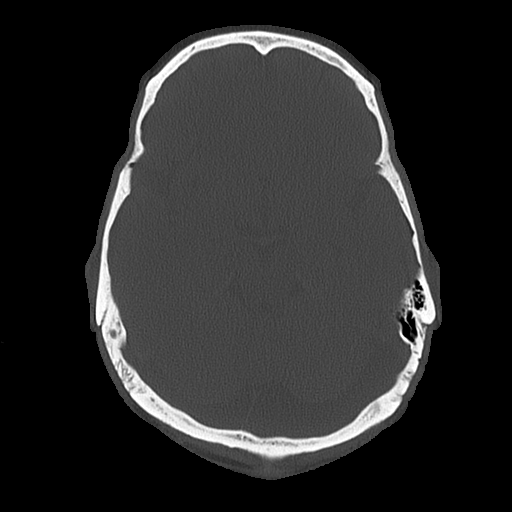

[Series 4: coronal soft · coronal · 0.40mm/px · 3 of 70 slices shown]
[im 24/70  brain]
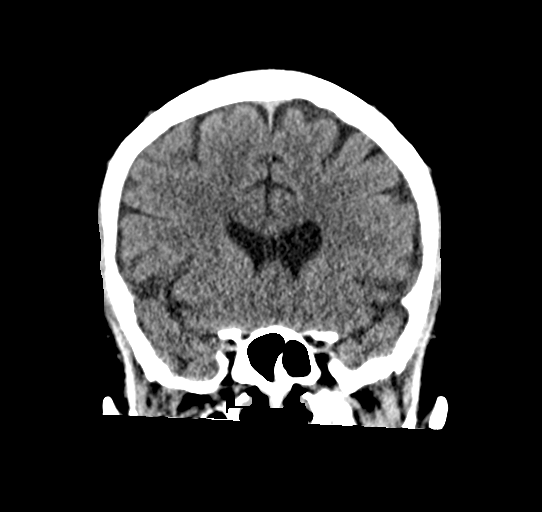
[im 31/70  brain]
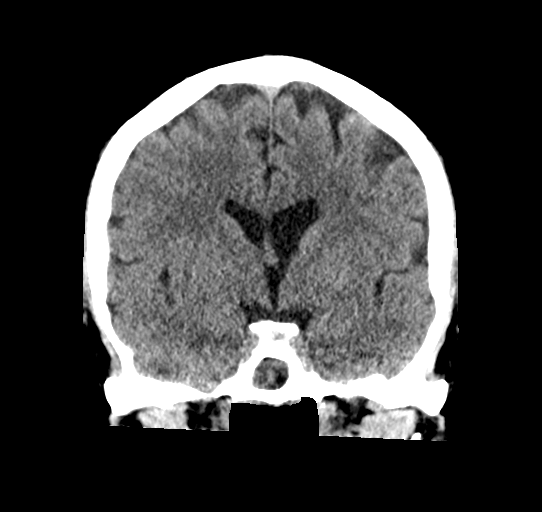
[im 39/70  brain]
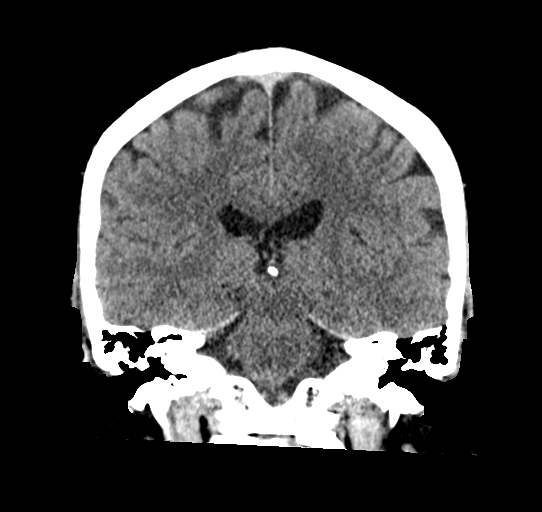

[Series 5: sagittal soft · sagittal · 0.37mm/px · 3 of 57 slices shown]
[im 19/57  brain]
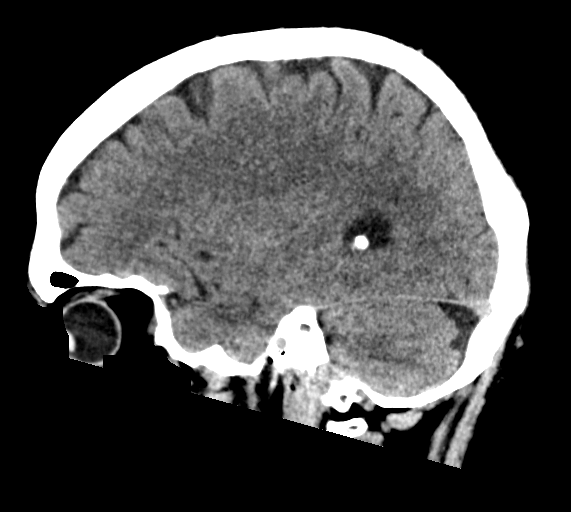
[im 29/57  brain]
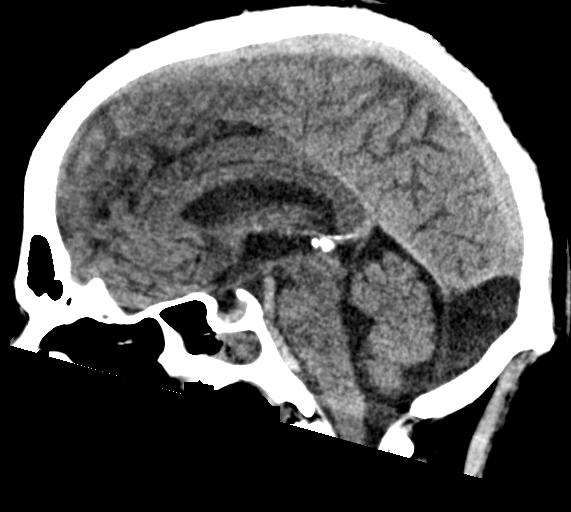
[im 38/57  brain]
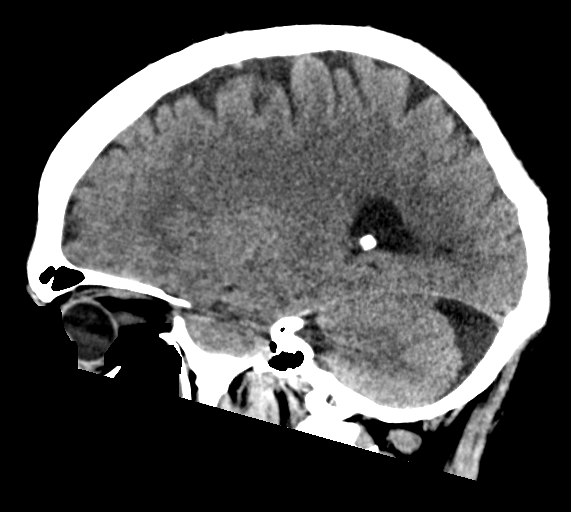

[16 of 47 positions shown; findings below may reference images not displayed]

FINDINGS: Brain:

Cerebral volume is normal.

Retrocerebellar CSF density prominence measuring up to 3 cm in AP
dimension. This may reflect a mega cisterna magna or posterior fossa
arachnoid cyst.

There is no acute intracranial hemorrhage.

No demarcated cortical infarct.

No extra-axial fluid collection.

No evidence of an intracranial mass.

No midline shift.

Vascular: No hyperdense vessel.

Skull: No fracture or aggressive osseous lesion.

Sinuses/Orbits: No mass or acute finding within the imaged orbits.
Mucosal thickening and frothy secretions scattered within the
bilateral ethmoid sinuses. Frothy secretions, moderate-sized fluid
level and background mild mucosal thickening within the right
sphenoid sinus. Frothy secretions, and background mild mucosal
thickening, within the left sphenoid sinus. Small-to-moderate sized
fluid level, and background mild mucosal thickening, within the left
maxillary sinus at the imaged levels.
IMPRESSION: 1. No evidence of acute intracranial abnormality.
2. Mega cisterna magna versus posterior fossa arachnoid cyst.
3. Paranasal sinus disease at the imaged levels, as described.

## 2021-11-01 MED ORDER — GADOBUTROL 1 MMOL/ML IV SOLN
10.0000 mL | Freq: Once | INTRAVENOUS | Status: AC | PRN
  Administered 2021-11-01: 10 mL via INTRAVENOUS
  Filled 2021-11-01: qty 10

## 2021-11-01 MED ORDER — TICAGRELOR 90 MG PO TABS: 90.0000 mg | ORAL_TABLET | Freq: Two times a day (BID) | ORAL | 12 refills | Status: DC

## 2021-11-01 MED ORDER — LISINOPRIL 20 MG PO TABS: 20.0000 mg | ORAL_TABLET | Freq: Every day | ORAL | 3 refills | Status: DC

## 2021-11-15 NOTE — Progress Notes (Unsigned)
Cardiology Office Note:    Date:  11/15/2021   ID:  Curtis Marshall, DOB November 23, 1970, MRN [id number]  PCP:  Lysbeth Galas, NP  [facility] Cardiologist:  Tiffeny Minchew [place], MD  [facility] Electrophysiologist:  None   Chief Complaint: Hospital follow-up  History of Present Illness:    Curtis Marshall is a 51 y.o. male with a hx of hypertension and tobacco abuse patient here for hospital follow-up.  The patient was initially presented to [facility] for evaluation of chest pain on 10/15/21.  EKG showed ST elevation in anterior leads.  Code STEMI activated and transferred to [facility].  Upon arrival, patient was chest pain-free with normalization of ST segment.  Emergent cardiac cath showed mild nonobstructive disease otherwise no culprit lesion.  Moderately elevated LVEDP.  It was felt the etiology of ACS could be due to vasospasm, resolved thrombus or hypertension.  Recommended medical management with dual antiplatelet therapy.  Troponin peaked at 340. Echocardiogram with preserved LV function.  Added statin and Imdur.  Home propranolol changed to Toprol-XL.  He was seen in follow up on 11/01/21 and reported new R sided weakness persistently and intermittent slurred speech since discharge.  No headache or blurry vision.  He went to work 1 day after discharge and had sudden onset severe dizziness with slurred speech and brief syncope for few seconds.  No other episode.  He denies chest pain or shortness of breath.  Reports compliance with medication.  He has stopped smoking tobacco since discharge. There was concern that he may have had a subacute CVA. Stat CT was negative. MRI/MRA of the head and neck was also OK. Referred to Neuro. Scheduled on 11/20/21.   Past Medical History:  Diagnosis Date   Bronchitis    Hypertension    ST elevation myocardial infarction (STEMI) of anterior wall (HCC) 10/2021   Emergent cath with mild nonobstructive CAD.  Patient had MINOCA.    Past  Surgical History:  Procedure Laterality Date   LEFT HEART CATH AND CORONARY ANGIOGRAPHY N/A 10/15/2021   Procedure: LEFT HEART CATH AND CORONARY ANGIOGRAPHY;  Surgeon: [place], Zedrick Springsteen M, MD;  Location: [facility] INVASIVE CV LAB;  Service: Cardiovascular;  Laterality: N/A;    Current Medications: No outpatient medications have been marked as taking for the 11/18/21 encounter (Appointment) with [place], Nikesha Kwasny M, MD.     Allergies:   Pepto-bismol [bismuth subsalicylate]   Social History   Socioeconomic History   Marital status: Single    Spouse name: Not on file   Number of children: Not on file   Years of education: Not on file   Highest education level: Not on file  Occupational History   Occupation: works at [employer]  Tobacco Use   Smoking status: Every Day    Packs/day: 1.00    Types: Cigarettes   Smokeless tobacco: Never  Substance and Sexual Activity   Alcohol use: Never   Drug use: Never   Sexual activity: Not on file  Other Topics Concern   Not on file  Social History Narrative   Not on file   Social Determinants of Health   Financial Resource Strain: Not on file  Food Insecurity: Not on file  Transportation Needs: Not on file  Physical Activity: Not on file  Stress: Not on file  Social Connections: Not on file     Family History: The patient's family history includes Heart disease in his father.    ROS:   Please see  the history of present illness.    All other systems reviewed and are negative.   EKGs/Labs/Other Studies Reviewed:    The following studies were reviewed today:  LEFT HEART CATH AND CORONARY ANGIOGRAPHY 6/6/223 Ost LAD to Mid LAD lesion is 20% stenosed.   The left ventricular systolic function is normal.   LV end diastolic pressure is moderately elevated.   The left ventricular ejection fraction is 55-65% by visual estimate.   Mild nonobstructive CAD. No culprit lesion seen  Moderately elevated LVEDP Normal LV function   Plan:  etiology of acute syndrome could be vasospasm, resolved thrombus or HTN. Recommend medical management with DAPT, BP control and smoking cessation. Will check Echo.    Echo 10/15/21 1. Left ventricular ejection fraction, by estimation, is 55 to 60%. The  left ventricle has normal function. The left ventricle has no regional  wall motion abnormalities. There is moderate concentric left ventricular  hypertrophy. Left ventricular  diastolic parameters were normal.   2. Right ventricular systolic function is normal. The right ventricular  size is normal. Tricuspid regurgitation signal is inadequate for assessing  PA pressure.   3. The mitral valve is grossly normal. Trivial mitral valve  regurgitation. No evidence of mitral stenosis.   4. The aortic valve is grossly normal. Aortic valve regurgitation is not  visualized. No aortic stenosis is present.   5. The inferior vena cava is dilated in size with >50% respiratory  variability, suggesting right atrial pressure of 8 mmHg.   EKG:  EKG is not ordered today.    Recent Labs: 10/15/2021: ALT 20 10/16/2021: BUN 14; Creatinine, Ser 0.76; Hemoglobin 13.9; Platelets 306; Potassium 3.8; Sodium 135  Recent Lipid Panel    Component Value Date/Time   CHOL 132 10/16/2021 0228   TRIG 142 10/16/2021 0228   HDL 30 (L) 10/16/2021 0228   CHOLHDL 4.4 10/16/2021 0228   VLDL 28 10/16/2021 0228   LDLCALC 74 10/16/2021 0228    Physical Exam:    VS:  There were no vitals taken for this visit.    Wt Readings from Last 3 Encounters:  11/01/21 277 lb (125.6 kg)  10/16/21 271 lb (122.9 kg)  01/22/20 278 lb (126.1 kg)     GEN:  Well nourished, well developed in no acute distress HEENT: Normal NECK: No JVD; No carotid bruits LYMPHATICS: No lymphadenopathy CARDIAC: RRR, no murmurs, rubs, gallops RESPIRATORY:  Clear to auscultation without rales, wheezing or rhonchi  ABDOMEN: Soft, non-tender, non-distended MUSCULOSKELETAL:  No edema; No deformity  SKIN:  Warm and dry NEUROLOGIC:  Alert and oriented x 3. RUE and RLE weakness  PSYCHIATRIC:  Normal affect   ASSESSMENT AND PLAN:    Anterior STEMI Cath with mild nonobstructive CAD.  It was felt that ACS could be due to vasospasm, resolved thrombus or hypertension.  Plan for dual antiplatelet therapy with aspirin and Brilinta for one year for ACS Continue nitrates and beta blocker.   2.  Hypertension -Blood pressure soft low.  Will reduce lisinopril to half a dose at 20 mg daily.  3.  Slurred speech and right-sided weakness Patient reported intermittent slurred speech and right-sided weakness (lower extremity> upper extremity) since discharge.  Also 1 severe episode of dizziness and brief syncope.  Stat CT head and MRI/MRA negative.    Medication Adjustments/Labs and Tests Ordered: Current medicines are reviewed at length with the patient today.  Concerns regarding medicines are outlined above.  No orders of the defined types were placed in this  encounter.  No orders of the defined types were placed in this encounter.   There are no Patient Instructions on file for this visit.   Signed, Sarinah Doetsch [place], MD  11/15/2021 2:12 PM     [facility]

## 2021-11-16 ENCOUNTER — Other Ambulatory Visit: Payer: Self-pay | Admitting: Physician Assistant

## 2021-11-18 ENCOUNTER — Ambulatory Visit (INDEPENDENT_AMBULATORY_CARE_PROVIDER_SITE_OTHER): Payer: Medicaid Other | Admitting: Cardiology

## 2021-11-18 ENCOUNTER — Encounter: Payer: Self-pay | Admitting: Cardiology

## 2021-11-18 VITALS — BP 115/78 | HR 71 | Ht 74.0 in | Wt 281.4 lb

## 2021-11-18 DIAGNOSIS — I200 Unstable angina: Secondary | ICD-10-CM

## 2021-11-18 DIAGNOSIS — R531 Weakness: Secondary | ICD-10-CM

## 2021-11-18 DIAGNOSIS — I10 Essential (primary) hypertension: Secondary | ICD-10-CM

## 2021-11-18 DIAGNOSIS — R4781 Slurred speech: Secondary | ICD-10-CM

## 2021-11-18 DIAGNOSIS — Z720 Tobacco use: Secondary | ICD-10-CM

## 2021-11-18 NOTE — Telephone Encounter (Signed)
This is Dr. Jordan's pt. °

## 2021-11-18 NOTE — Patient Instructions (Signed)
Medication Instructions:  Continue same medications *If you need a refill on your cardiac medications before your next appointment, please call your pharmacy*   Lab Work: None ordered   Testing/Procedures: None ordered   Follow-Up: At [facility], you and your health needs are our priority.  As part of our continuing mission to provide you with exceptional heart care, we have created designated Provider Care Teams.  These Care Teams include your primary Cardiologist (physician) and Advanced Practice Providers (APPs -  Physician Assistants and Nurse Practitioners) who all work together to provide you with the care you need, when you need it.  We recommend signing up for the patient portal called "MyChart".  Sign up information is provided on this After Visit Summary.  MyChart is used to connect with patients for Virtual Visits (Telemedicine).  Patients are able to view lab/test results, encounter notes, upcoming appointments, etc.  Non-urgent messages can be sent to your provider as well.   To learn more about what you can do with MyChart, go to [email or web address].    Your next appointment:  4 months    The format for your next appointment: Office    Provider:  Dr.Jordan   Important Information About Sugar

## 2021-11-20 ENCOUNTER — Encounter: Payer: Self-pay | Admitting: Neurology

## 2021-11-20 ENCOUNTER — Ambulatory Visit (INDEPENDENT_AMBULATORY_CARE_PROVIDER_SITE_OTHER): Payer: Medicaid Other | Admitting: Neurology

## 2021-11-20 VITALS — BP 128/77 | HR 72 | Ht 74.0 in | Wt 281.0 lb

## 2021-11-20 DIAGNOSIS — M5412 Radiculopathy, cervical region: Secondary | ICD-10-CM | POA: Diagnosis not present

## 2021-11-20 NOTE — Progress Notes (Signed)
[facility]  PATIENT: Curtis Marshall DOB: 09-03-1970  REQUESTING CLINICIAN: Manson Passey, PA HISTORY FROM: Patient  REASON FOR VISIT: Right side weakness and speech changes    HISTORICAL  CHIEF COMPLAINT:  Chief Complaint  Patient presents with   New Patient (Initial Visit)    Rm 13. Alone. NP internal referral for Deficit in communication due to slurred speech, right sided weakness.    HISTORY OF PRESENT ILLNESS:  This is a 51 year old gentleman past medical history of myocardial infarction last month, hypertension hyperlipidemia who is presenting with right-sided weakness and slurred speech.  Patient reports on June 6 he was diagnosed with heart attack, did have a cardiac cath and angiography.  Since discharge from the hospital he has been stable and follow-up with his cardiologist, at that visit he did complain of right-sided weakness and slurred speech.  He reports his right-sided weakness started a few days after being discharged from the hospital.  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  Patient reports this time he has been stressed due to thinking he might have a tumor and also stressed about his heart condition.  He reported family has noted that his speech was more slurred than before.   He denies any history of neck pain.    OTHER MEDICAL CONDITIONS: Recent heart attack, hypertension, hyperlipidemia   REVIEW OF SYSTEMS: Full 14 system review of systems performed and negative with exception of: as noted in the HPI   ALLERGIES: Allergies  Allergen Reactions   Pepto-Bismol [Bismuth Subsalicylate] Nausea And Vomiting    HOME MEDICATIONS: Outpatient Medications Prior to Visit  Medication Sig Dispense Refill   albuterol (VENTOLIN HFA) 108 (90 Base) MCG/ACT inhaler Inhale 2 puffs into the lungs every 6 (six) hours as needed for wheezing or  shortness of breath.     aspirin EC 81 MG tablet Take 1 tablet (81 mg total) by mouth daily. Swallow whole. 30 tablet 12   atorvastatin (LIPITOR) 40 MG tablet TAKE ONE (1) TABLET BY MOUTH EVERY DAY 90 tablet 3   HYDROcodone-acetaminophen (NORCO/VICODIN) 5-325 MG tablet Take 1 tablet by mouth 4 (four) times daily as needed.     isosorbide mononitrate (IMDUR) 30 MG 24 hr tablet TAKE ONE (1) TABLET BY MOUTH EVERY DAY 90 tablet 3   lisinopril (ZESTRIL) 20 MG tablet Take 1 tablet (20 mg total) by mouth daily. 90 tablet 3   meclizine (ANTIVERT) 25 MG tablet Take 25 mg by mouth 3 (three) times daily as needed for dizziness.     metoprolol succinate (TOPROL-XL) 25 MG 24 hr tablet TAKE ONE (1) TABLET BY MOUTH EVERY DAY 90 tablet 3   nitroGLYCERIN (NITROSTAT) 0.4 MG SL tablet Place 1 tablet (0.4 mg total) under the tongue every 5 (five) minutes x 3 doses as needed for chest pain. 25 tablet 12   propranolol ER (INDERAL LA) 60 MG 24 hr capsule Take by mouth as needed. Per patient taking as needed for migraine headaches     rizatriptan (MAXALT) 10 MG tablet Take 10 mg by mouth daily as needed for migraine.     ticagrelor (BRILINTA) 90 MG TABS tablet Take 1 tablet (90 mg total) by mouth 2 (two) times daily. 60 tablet 12   topiramate (TOPAMAX) 25 MG tablet at bedtime.     No facility-administered medications prior to visit.    PAST MEDICAL HISTORY: Past Medical  History:  Diagnosis Date   Bronchitis    Hypertension    ST elevation myocardial infarction (STEMI) of anterior wall (HCC) 10/2021   Emergent cath with mild nonobstructive CAD.  Patient had MINOCA.    PAST SURGICAL HISTORY: Past Surgical History:  Procedure Laterality Date   LEFT HEART CATH AND CORONARY ANGIOGRAPHY N/A 10/15/2021   Procedure: LEFT HEART CATH AND CORONARY ANGIOGRAPHY;  Surgeon: [place], Peter M, MD;  Location: [facility] INVASIVE CV LAB;  Service: Cardiovascular;  Laterality: N/A;    FAMILY HISTORY: Family History  Problem Relation  Age of Onset   Heart disease Father        died age 3    SOCIAL HISTORY: Social History   Socioeconomic History   Marital status: Single    Spouse name: Not on file   Number of children: Not on file   Years of education: Not on file   Highest education level: Not on file  Occupational History   Occupation: works at [employer] home [employer]  Tobacco Use   Smoking status: Every Day    Packs/day: 1.00    Types: Cigarettes   Smokeless tobacco: Never  Substance and Sexual Activity   Alcohol use: Never   Drug use: Never   Sexual activity: Not on file  Other Topics Concern   Not on file  Social History Narrative   Not on file   Social Determinants of Health   Financial Resource Strain: Not on file  Food Insecurity: Not on file  Transportation Needs: Not on file  Physical Activity: Not on file  Stress: Not on file  Social Connections: Not on file  Intimate Partner Violence: Not on file    PHYSICAL EXAM  GENERAL EXAM/CONSTITUTIONAL: Vitals:  Vitals:   11/20/21 1120  BP: 128/77  Pulse: 72  Weight: 281 lb (127.5 kg)  Height: 6\' 2"  (1.88 m)   Body mass index is 36.08 kg/m. Wt Readings from Last 3 Encounters:  11/20/21 281 lb (127.5 kg)  11/18/21 281 lb 6.4 oz (127.6 kg)  11/01/21 277 lb (125.6 kg)   Patient is in no distress; well developed, nourished and groomed; neck is supple  EYES: Pupils round and reactive to light, Visual fields full to confrontation, Extraocular movements intacts,   MUSCULOSKELETAL: Gait, strength, tone, movements noted in Neurologic exam below  NEUROLOGIC: MENTAL STATUS:      No data to display         awake, alert, oriented to person, place and time recent and remote memory intact normal attention and concentration language fluent, comprehension intact, naming intact fund of knowledge appropriate  CRANIAL NERVE:  2nd, 3rd, 4th, 6th - pupils equal and reactive to light, visual fields full to confrontation, extraocular  muscles intact, no nystagmus 5th - facial sensation symmetric 7th - facial strength symmetric 8th - hearing intact 9th - palate elevates symmetrically, uvula midline 11th - shoulder shrug symmetric 12th - tongue protrusion midline  MOTOR:  normal bulk and tone, full strength in the BUE, BLE except very mild 5-/5 weakness in the right finger intrinsics   SENSORY:  normal and symmetric to light touch  COORDINATION:  finger-nose-finger, fine finger movements normal  REFLEXES:  deep tendon reflexes present and symmetric  GAIT/STATION:  normal   DIAGNOSTIC DATA (LABS, IMAGING, TESTING) - I reviewed patient records, labs, notes, testing and imaging myself where available.  Lab Results  Component Value Date   WBC 13.2 (H) 10/16/2021   HGB 13.9 10/16/2021   HCT 39.1 10/16/2021  MCV 96.5 10/16/2021   PLT 306 10/16/2021      Component Value Date/Time   NA 135 10/16/2021 0928   K 3.8 10/16/2021 0928   CL 104 10/16/2021 0928   CO2 27 10/16/2021 0928   GLUCOSE 101 (H) 10/16/2021 0928   BUN 14 10/16/2021 0928   CREATININE 0.76 10/16/2021 0928   CALCIUM 9.1 10/16/2021 0928   PROT 6.2 (L) 10/15/2021 1143   ALBUMIN 3.6 10/15/2021 1143   AST 16 10/15/2021 1143   ALT 20 10/15/2021 1143   ALKPHOS 56 10/15/2021 1143   BILITOT 0.5 10/15/2021 1143   GFRNONAA >60 10/16/2021 0928   Lab Results  Component Value Date   CHOL 132 10/16/2021   HDL 30 (L) 10/16/2021   LDLCALC 74 10/16/2021   TRIG 142 10/16/2021   CHOLHDL 4.4 10/16/2021   Lab Results  Component Value Date   HGBA1C 5.8 (H) 10/15/2021   HGBA1C 5.8 (H) 10/15/2021   No results found for: "VITAMINB12" No results found for: "TSH"  CT head 11/01/21 1. No evidence of acute intracranial abnormality. 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 3. Paranasal sinus disease at the imaged levels, as described.  Negative MRA head   Negative MRA neck   Diffusion-weighted imaging reveals no acute  infarct.    ASSESSMENT AND PLAN  51 y.o. year old male with history of recent heart attack, hypertension hyperlipidemia who is presenting with complaint of right-sided weakness and slurred speech 3 days after discharge from the hospital for heart attack.  He did follow-up with his cardiologist who ordered a MRA head and neck and CT head which showed arachnoid cyst.  Patient stated during this time, he was very stressed out due not understanding these finding.  I have informed him that the cyst has been there for long time and is not causing any neurological symptoms at the moment.  On exam he has full strength, there is a subtle weakness in the right finger intrinsics but otherwise it is full in the upper and lower extremities.  His speech was normal, he was not aphasic.  At this time, I will proceed with MRI cervical spine to rule out cervical radiculopathy but I told patient that based on the brain imaging he does not have a stroke any abnormality that can cause the slurred speech and right-sided weakness.  Another consideration is stress and anxiety causing the symptoms.  He did report that after the heart attack he was very stressed out and also having the MRI with abnormal result make him worried a lot.  We spent extended amount of time discussing the MRI result and I provided reassurance regarding the arachnoid cyst.  I will contact him to go over the cervical spine MRI result, advised him to continue following up with his PCP and his cardiologist and to return sooner if his symptoms get worse.  He voices understanding.   1. Cervical radiculopathy      Patient Instructions  Continue current medications We will obtain a MRI Cervical spine to rule out pinched nerve, I will contact you to go over the result Continue to follow with primary care doctor and cardiologist Return as needed or sooner if worse   Orders Placed This Encounter  Procedures   MR CERVICAL SPINE WO CONTRAST    No orders  of the defined types were placed in this encounter.   Return if symptoms worsen or fail to improve.  I have spent a total of 65 minutes  dedicated to this patient today, preparing to see patient, performing a medically appropriate examination and evaluation, ordering tests and/or medications and procedures, and counseling and educating the patient/family/caregiver; independently interpreting result and communicating results to the family/patient/caregiver; and documenting clinical information in the electronic medical record.   Windell Norfolk, MD 11/20/2021, 4:23 PM  [facility] [address] [place], [place] [address] [phone]

## 2021-11-20 NOTE — Patient Instructions (Signed)
Continue current medications We will obtain a MRI Cervical spine to rule out pinched nerve, I will contact you to go over the result Continue to follow with primary care doctor and cardiologist Return as needed or sooner if worse

## 2021-11-25 ENCOUNTER — Telehealth: Payer: Self-pay | Admitting: Neurology

## 2021-11-25 NOTE — Telephone Encounter (Signed)
medicaid healthy blue [employer] Harvey: [id number] exp. 11/25/21-01/23/22 sent to [facility]

## 2021-12-13 ENCOUNTER — Telehealth: Payer: Self-pay

## 2021-12-13 NOTE — Telephone Encounter (Signed)
Received medical forms from [employer] benefits.Dr.Jordan completed forms clearing patient to return to work  12/16/21 with no restrictions.Forms faxed back to fax # [phone].

## 2021-12-25 ENCOUNTER — Other Ambulatory Visit: Payer: Self-pay | Admitting: *Deleted

## 2021-12-25 NOTE — Patient Outreach (Signed)
Medicaid Managed Care   Nurse Care Manager Note  12/25/2021 Name:  Curtis Marshall MRN:  [id number] DOB:  1970/07/28  Curtis Marshall is an 51 y.o. year old male who is a primary patient of [place], [place] Scrape, NP.  The [facility] Managed Care Coordination team was consulted for assistance with:    Cardiovascular Health  Curtis Marshall was given information about Medicaid Managed Care Coordination team services today. Curtis Marshall Patient agreed to services and verbal consent obtained.  Engaged with patient by telephone for initial visit in response to provider referral for case management and/or care coordination services.   Assessments/Interventions:  Review of past medical history, allergies, medications, health status, including review of consultants reports, laboratory and other test data, was performed as part of comprehensive evaluation and provision of chronic care management services.  SDOH (Social Determinants of Health) assessments and interventions performed: SDOH Interventions    Flowsheet Row Most Recent Value  SDOH Interventions   Food Insecurity Interventions Intervention Not Indicated  Housing Interventions Intervention Not Indicated  Physical Activity Interventions Intervention Not Indicated  Transportation Interventions Intervention Not Indicated       Care Plan  Allergies  Allergen Reactions   Pepto-Bismol [Bismuth Subsalicylate] Nausea And Vomiting    Medications Reviewed Today     Reviewed by Curtis Dach, RN (Registered Nurse) on 12/25/21 at [phone]  Med List Status: <None>   Medication Order Taking? Sig Documenting Provider Last Dose Status Informant  albuterol (VENTOLIN HFA) 108 (90 Base) MCG/ACT inhaler [id number] Yes Inhale 2 puffs into the lungs every 6 (six) hours as needed for wheezing or shortness of breath. [provider] Taking Active Self  aspirin EC 81 MG tablet [id number] Yes Take 1 tablet (81 mg total) by mouth daily. Swallow  whole. [place], Sharrell Ku, PA Taking Active   atorvastatin (LIPITOR) 40 MG tablet [id number] Yes TAKE ONE (1) TABLET BY MOUTH EVERY DAY Bhagat, Bhavinkumar, PA Taking Active   HYDROcodone-acetaminophen (NORCO/VICODIN) 5-325 MG tablet [id number] Yes Take 1 tablet by mouth 4 (four) times daily as needed. [provider] Taking Active   isosorbide mononitrate (IMDUR) 30 MG 24 hr tablet [id number] Yes TAKE ONE (1) TABLET BY MOUTH EVERY DAY Bhagat, Bhavinkumar, PA Taking Active   lisinopril (ZESTRIL) 20 MG tablet [id number] Yes Take 1 tablet (20 mg total) by mouth daily. Manson Passey, PA Taking Active   meclizine (ANTIVERT) 25 MG tablet [id number] Yes Take 25 mg by mouth 3 (three) times daily as needed for dizziness. [provider] Taking Active Self  metoprolol succinate (TOPROL-XL) 25 MG 24 hr tablet [id number] Yes TAKE ONE (1) TABLET BY MOUTH EVERY DAY Bhagat, Bhavinkumar, PA Taking Active   nitroGLYCERIN (NITROSTAT) 0.4 MG SL tablet [id number] Yes Place 1 tablet (0.4 mg total) under the tongue every 5 (five) minutes x 3 doses as needed for chest pain. Manson Passey, PA Taking Active            Med Note (CAMPOS, ROWEENA A   Mon Nov 18, 2021  9:20 AM) PRN  propranolol ER (INDERAL LA) 60 MG 24 hr capsule [id number] Yes Take by mouth as needed. Per patient taking as needed for migraine headaches [provider] Taking Active   rizatriptan (MAXALT) 10 MG tablet [id number] Yes Take 10 mg by mouth daily as needed for migraine. [provider] Taking Active Self  ticagrelor (BRILINTA) 90 MG TABS tablet [id number] Yes Take 1 tablet (90 mg total) by mouth 2 (two) times daily. Manson Passey,  PA Taking Active   topiramate (TOPAMAX) 25 MG tablet [id number] Yes at bedtime. [provider] Taking Active             Patient Active Problem List   Diagnosis Date Noted   Unstable angina (HCC)    Secondary hypertension 07/23/2016    Conditions to be  addressed/monitored per PCP order:   Cardiovascular Health  Care Plan : RN Care Manager Plan of Care  Updates made by Curtis Dach, RN since 12/25/2021 12:00 AM     Problem: Health Management needs related to Improving Cardiovascular Health      Long-Range Goal: Development of Plan of Care to address Health Management needs related to Improving Cardiovascular Health   Start Date: 12/25/2021  Expected End Date: 03/25/2022  Priority: High  Note:   Current Barriers:  Knowledge Deficits related to plan of care for management of Weight Loss Mr. Curtis Marshall is working to improve his Cardiovascular health. He quit smoking 2.5 months ago, he is walking every day for one hour and he would like to lose weight with a goal weight of #200 from #281. He is aware of his upcoming appointment with PCP and has transportation.  RNCM Clinical Goal(s):  Patient will verbalize understanding of plan for management of Weight Loss as evidenced by patient reports take all medications exactly as prescribed and will call provider for medication related questions as evidenced by patient reports and documentation in EMR    attend all scheduled medical appointments: 01/21/22 with PCP as evidenced by provider documentation        through collaboration with RN Care manager, provider, and care team.   Interventions: Inter-disciplinary care team collaboration (see longitudinal plan of care) Evaluation of current treatment plan related to  self management and patient's adherence to plan as established by provider   Weight Loss:  (Status: New goal.)  Advised patient to discuss with primary care provider options regarding weight management;  Provided verbal and/or written education to patient re: provider recommended life style modifications;  Reviewed recommended dietary changes: avoid fad diets, make small/incremental dietary and exercise changes, eat at the table and avoid eating in front of the TV, plan management of  cravings, monitor snacking and cravings in food diary; Assessed social determinant of health barriers;  Reviewed enhanced benefits provided by [facility], advised patient to contact member services [phone] Discussed weight loss goals, patient's ultimate goal is to get down to #200, advised patient to start with smaller goals and work towards the larger goal Congratulated patient on smoking cessation Discussed the benefits of exercise, provided encouragement  Reviewed upcoming appointment: PCP on 01/21/22 Advised patient to call Neurology office regarding scheduling of cervical MRI Provided patient with Urgent Tooth [phone], call for denture evaluation if he chooses  Patient Goals/Self-Care Activities: Take medications as prescribed   Attend all scheduled provider appointments Call provider office for new concerns or questions  Work toward #20 weight loss        Follow Up:  Patient agrees to Care Plan and Follow-up.  Plan: The Managed Medicaid care management team will reach out to the patient again over the next 60 days.  Date/time of next scheduled RN care management/care coordination outreach:  02/24/22 @ 9am  Estanislado Emms RN, BSN [place]  [employer]

## 2021-12-25 NOTE — Patient Instructions (Signed)
Visit Information  Mr. Schopf was given information about Medicaid Managed Care team care coordination services as a part of their Healthy [facility] Medicaid benefit. [name] verbally consented to engagement with the [facility] Managed Care team.   If you are experiencing a medical emergency, please call 911 or report to your local emergency department or urgent care.   If you have a non-emergency medical problem during routine business hours, please contact your provider's office and ask to speak with a nurse.   For questions related to your Healthy [facility] health plan, please call: [phone] or visit the homepage here: [email or web address]  If you would like to schedule transportation through your Healthy [facility] plan, please call the following number at least 2 days in advance of your appointment: [phone]  For information about your ride after you set it up, call Ride Assist at [phone]. Use this number to activate a Will Call pickup, or if your transportation is late for a scheduled pickup. Use this number, too, if you need to make a change or cancel a previously scheduled reservation.  If you need transportation services right away, call [phone]. The after-hours call center is staffed 24 hours to handle ride assistance and urgent reservation requests (including discharges) 365 days a year. Urgent trips include sick visits, hospital discharge requests and life-sustaining treatment.  Call the [facility] Line at [phone], at any time, 24 hours a day, 7 days a week. If you are in danger or need immediate medical attention call 911.  If you would like help to quit smoking, call 1-800-QUIT-NOW ((801)330-5176) OR Espaol: 1-855-Djelo-Ya (2-595-638-7564) o para ms informacin haga clic aqu or Text READY to 332-951 to register via text  Mr. Ketchum,   Please see education materials related to weight loss  provided as print materials.   The patient verbalized understanding of instructions, educational materials, and care plan provided today and agreed to receive a mailed copy of patient instructions, educational materials, and care plan.   Telephone follow up appointment with Managed Medicaid care management team member scheduled for:02/24/22 @ 9am  Estanislado Emms RN, BSN [place]  [facility] RN Care Coordinator   Following is a copy of your plan of care:  Care Plan : RN Care Manager Plan of Care  Updates made by [name], RN since 12/25/2021 12:00 AM     Problem: Health Management needs related to Improving Cardiovascular Health      Long-Range Goal: Development of Plan of Care to address Health Management needs related to Improving Cardiovascular Health   Start Date: 12/25/2021  Expected End Date: 03/25/2022  Priority: High  Note:   Current Barriers:  Knowledge Deficits related to plan of care for management of Weight Loss Mr. Sangha is working to improve his Cardiovascular health. He quit smoking 2.5 months ago, he is walking every day for one hour and he would like to lose weight with a goal weight of #200 from #281. He is aware of his upcoming appointment with PCP and has transportation.  RNCM Clinical Goal(s):  Patient will verbalize understanding of plan for management of Weight Loss as evidenced by patient reports take all medications exactly as prescribed and will call provider for medication related questions as evidenced by patient reports and documentation in EMR    attend all scheduled medical appointments: 01/21/22 with PCP as evidenced by provider documentation        through collaboration with RN Care manager, provider, and care team.  Interventions: Inter-disciplinary care team collaboration (see longitudinal plan of care) Evaluation of current treatment plan related to  self management and patient's adherence to plan as established by  provider   Weight Loss:  (Status: New goal.)  Advised patient to discuss with primary care provider options regarding weight management;  Provided verbal and/or written education to patient re: provider recommended life style modifications;  Reviewed recommended dietary changes: avoid fad diets, make small/incremental dietary and exercise changes, eat at the table and avoid eating in front of the TV, plan management of cravings, monitor snacking and cravings in food diary; Assessed social determinant of health barriers;  Reviewed enhanced benefits provided by [facility], advised patient to contact member services [phone] Discussed weight loss goals, patient's ultimate goal is to get down to #200, advised patient to start with smaller goals and work towards the larger goal Congratulated patient on smoking cessation Discussed the benefits of exercise, provided encouragement  Reviewed upcoming appointment: PCP on 01/21/22 Advised patient to call Neurology office regarding scheduling of cervical MRI Provided patient with Urgent Tooth [phone], call for denture evaluation if he chooses  Patient Goals/Self-Care Activities: Take medications as prescribed   Attend all scheduled provider appointments Call provider office for new concerns or questions  Work toward #20 weight loss

## 2022-02-24 ENCOUNTER — Other Ambulatory Visit: Payer: Self-pay | Admitting: *Deleted

## 2022-02-24 NOTE — Patient Outreach (Signed)
  Medicaid Managed Care   Unsuccessful Attempt Note   02/24/2022 Name: Curtis Marshall MRN: [id number] DOB: 1970-06-02  Referred by: Waldon Reining, NP Reason for referral : High Risk Managed Medicaid (Unsuccessful RNCM follow up outreach)   An unsuccessful telephone outreach was attempted today. The patient was referred to the case management team for assistance with care management and care coordination.    Follow Up Plan: A HIPAA compliant phone message was left for the patient providing contact information and requesting a return call.    Lurena Joiner RN, BSN [place]  [employer] manager

## 2022-02-24 NOTE — Patient Instructions (Signed)
Visit Information  Mr. LORAS GRIESHOP  - as a part of your Medicaid benefit, you are eligible for care management and care coordination services at no cost or copay. I was unable to reach you by phone today but would be happy to help you with your health related needs. Please feel free to call me @ [phone].   A member of the Managed Medicaid care management team will reach out to you again over the next 14 days.   Lurena Joiner RN, BSN [place]  [employer] manager

## 2022-02-25 ENCOUNTER — Telehealth: Payer: Self-pay | Admitting: Family Medicine

## 2022-02-25 NOTE — Telephone Encounter (Signed)
..   Medicaid Managed Care   Unsuccessful Outreach Note  02/25/2022 Name: DEITRICH STEVE MRN: [id number] DOB: 1970-06-25  Referred by: Waldon Reining, NP Reason for referral : High Risk Managed Medicaid (I called the patient today to get him rescheduled with the MM RNCM. I left my name and number on his VM.)   A second unsuccessful telephone outreach was attempted today. The patient was referred to the case management team for assistance with care management and care coordination.   Follow Up Plan: The care management team will reach out to the patient again over the next 7 days.    [place]

## 2022-03-14 ENCOUNTER — Other Ambulatory Visit: Payer: Self-pay | Admitting: *Deleted

## 2022-03-14 NOTE — Patient Instructions (Signed)
Visit Information  Mr. Curtis Marshall was given information about Medicaid Managed Care team care coordination services as a part of their Healthy [place] Medicaid benefit. Curtis Marshall verbally consented to engagement with the [facility] Managed Care team.   If you are experiencing a medical emergency, please call 911 or report to your local emergency department or urgent care.   If you have a non-emergency medical problem during routine business hours, please contact your provider's office and ask to speak with a nurse.   For questions related to your Healthy [facility] health plan, please call: [phone] or visit the homepage here: [email or web address]  If you would like to schedule transportation through your Healthy [facility] plan, please call the following number at least 2 days in advance of your appointment: [phone]  For information about your ride after you set it up, call Ride Assist at [phone]. Use this number to activate a Will Call pickup, or if your transportation is late for a scheduled pickup. Use this number, too, if you need to make a change or cancel a previously scheduled reservation.  If you need transportation services right away, call [phone]. The after-hours call center is staffed 24 hours to handle ride assistance and urgent reservation requests (including discharges) 365 days a year. Urgent trips include sick visits, hospital discharge requests and life-sustaining treatment.  Call the [place] at [phone], at any time, 24 hours a day, 7 days a week. If you are in danger or need immediate medical attention call 911.  If you would like help to quit smoking, call 1-800-QUIT-NOW 520-857-0069) OR Espaol: 1-855-Djelo-Ya QO:409462) o para ms informacin haga clic aqu or Text READY to 200-400 to register via text  Curtis Marshall,   Please see education materials related to weight loss  provided as print materials.   The patient verbalized understanding of instructions, educational materials, and care plan provided today and agreed to receive a mailed copy of patient instructions, educational materials, and care plan.   Telephone follow up appointment with Managed Medicaid care management team member scheduled for:05/14/22 @ 1:15pm  Lurena Joiner RN, BSN [place] RN Care Coordinator   Following is a copy of your plan of care:  Care Plan : RN Care Manager Plan of Care  Updates made by Melissa Montane, RN since 03/14/2022 12:00 AM     Problem: Health Management needs related to Improving Cardiovascular Health      Long-Range Goal: Development of Plan of Care to address Health Management needs related to Improving Cardiovascular Health   Start Date: 12/25/2021  Expected End Date: 05/16/2022  Priority: High  Note:   Current Barriers:  Knowledge Deficits related to plan of care for management of Weight Loss Curtis Marshall continues to work on improving his heart health with smoking cessation. He has not had a chance to follow up with member benefits for weight loss from [facility].   RNCM Clinical Goal(s):  Patient will verbalize understanding of plan for management of Weight Loss as evidenced by patient reports take all medications exactly as prescribed and will call provider for medication related questions as evidenced by patient reports and documentation in EMR    attend all scheduled medical appointments: 03/24/22 with Cardiology and 06/12/22 with PCP as evidenced by provider documentation        through collaboration with [place] manager, provider, and care team.   Interventions: Inter-disciplinary care team collaboration (see longitudinal plan of care) Evaluation of  current treatment plan related to  self management and patient's adherence to plan as established by provider   Weight Loss:  (Status: Goal on track: NO.)  Advised patient to  discuss with primary care provider options regarding weight management;  Provided verbal and/or written education to patient re: provider recommended life style modifications;  Reviewed recommended dietary changes: avoid fad diets, make small/incremental dietary and exercise changes, eat at the table and avoid eating in front of the TV, plan management of cravings, monitor snacking and cravings in food diary; Reviewed enhanced benefits provided by [facility], advised patient to contact member services [phone] Discussed weight loss goals, patient's ultimate goal is to get down to #200, advised patient to start with smaller goals and work towards the larger goal Congratulated patient on smoking cessation  Reviewed upcoming appointment: PCP on 01/21/22 Advised patient to choose healthy snacks or go for a walk when he desires to smoke, examples provided  Patient Goals/Self-Care Activities: Take medications as prescribed   Attend all scheduled provider appointments Call provider office for new concerns or questions  Work toward #20 weight loss

## 2022-03-14 NOTE — Patient Outreach (Signed)
Medicaid Managed Care   Nurse Care Manager Note  03/14/2022 Name:  Curtis Marshall MRN:  [id number] DOB:  Dec 28, 1970  Curtis Marshall is an 51 y.o. year old male who is a primary patient of [place], [place] Scrape, NP.  The [facility] Managed Care Coordination team was consulted for assistance with:    Weight loss  Mr. Moeller was given information about Medicaid Managed Care Coordination team services today. Curtis Marshall Patient agreed to services and verbal consent obtained.  Engaged with patient by telephone for follow up visit in response to provider referral for case management and/or care coordination services.   Assessments/Interventions:  Review of past medical history, allergies, medications, health status, including review of consultants reports, laboratory and other test data, was performed as part of comprehensive evaluation and provision of chronic care management services.  SDOH (Social Determinants of Health) assessments and interventions performed: SDOH Interventions    Flowsheet Row Patient Outreach Telephone from 12/25/2021 in [facility] Care Coordination  SDOH Interventions   Food Insecurity Interventions Intervention Not Indicated  Housing Interventions Intervention Not Indicated  Transportation Interventions Intervention Not Indicated  Physical Activity Interventions Intervention Not Indicated       Care Plan  Allergies  Allergen Reactions   Pepto-Bismol [Bismuth Subsalicylate] Nausea And Vomiting    Medications Reviewed Today     Reviewed by Curtis Dach, RN (Registered Nurse) on 03/14/22 at 1332  Med List Status: <None>   Medication Order Taking? Sig Documenting Provider Last Dose Status Informant  albuterol (VENTOLIN HFA) 108 (90 Base) MCG/ACT inhaler [id number] Yes Inhale 2 puffs into the lungs every 6 (six) hours as needed for wheezing or shortness of breath. [provider] Taking Active Self  aspirin EC 81 MG tablet  [id number] Yes Take 1 tablet (81 mg total) by mouth daily. Swallow whole. [place], Sharrell Ku, PA Taking Active   atorvastatin (LIPITOR) 40 MG tablet [id number] Yes TAKE ONE (1) TABLET BY MOUTH EVERY DAY Bhagat, Bhavinkumar, PA Taking Active   HYDROcodone-acetaminophen (NORCO/VICODIN) 5-325 MG tablet [id number] Yes Take 1 tablet by mouth 4 (four) times daily as needed. [provider] Taking Active   isosorbide mononitrate (IMDUR) 30 MG 24 hr tablet [id number] Yes TAKE ONE (1) TABLET BY MOUTH EVERY DAY Bhagat, Bhavinkumar, PA Taking Active   lisinopril (ZESTRIL) 20 MG tablet [id number] Yes Take 1 tablet (20 mg total) by mouth daily. Manson Passey, PA Taking Active   meclizine (ANTIVERT) 25 MG tablet [id number] Yes Take 25 mg by mouth 3 (three) times daily as needed for dizziness. [provider] Taking Active Self  metoprolol succinate (TOPROL-XL) 25 MG 24 hr tablet [id number] Yes TAKE ONE (1) TABLET BY MOUTH EVERY DAY Bhagat, Bhavinkumar, PA Taking Active   nitroGLYCERIN (NITROSTAT) 0.4 MG SL tablet [id number] Yes Place 1 tablet (0.4 mg total) under the tongue every 5 (five) minutes x 3 doses as needed for chest pain. Manson Passey, PA Taking Active            Med Note (CAMPOS, ROWEENA A   Mon Nov 18, 2021  9:20 AM) PRN  propranolol ER (INDERAL LA) 60 MG 24 hr capsule [id number] Yes Take by mouth as needed. Per patient taking as needed for migraine headaches [provider] Taking Active   rizatriptan (MAXALT) 10 MG tablet [id number] Yes Take 10 mg by mouth daily as needed for migraine. [provider] Taking Active Self  ticagrelor (BRILINTA) 90 MG TABS tablet [id number] Yes Take 1 tablet (90  mg total) by mouth 2 (two) times daily. Leanor Kail, PA Taking Active   topiramate (TOPAMAX) 25 MG tablet [id number] Yes at bedtime. [provider] Taking Active             Patient Active Problem List   Diagnosis Date Noted   Unstable angina ([place])     Secondary hypertension 07/23/2016    Conditions to be addressed/monitored per PCP order:   pain  Care Plan : RN Care Manager Plan of Care  Updates made by Melissa Montane, RN since 03/14/2022 12:00 AM     Problem: Health Management needs related to Improving Cardiovascular Health      Long-Range Goal: Development of Plan of Care to address Health Management needs related to Improving Cardiovascular Health   Start Date: 12/25/2021  Expected End Date: 05/16/2022  Priority: High  Note:   Current Barriers:  Knowledge Deficits related to plan of care for management of Weight Loss Mr. Xiang continues to work on improving his heart health with smoking cessation. He has not had a chance to follow up with member benefits for weight loss from [facility].   RNCM Clinical Goal(s):  Patient will verbalize understanding of plan for management of Weight Loss as evidenced by patient reports take all medications exactly as prescribed and will call provider for medication related questions as evidenced by patient reports and documentation in EMR    attend all scheduled medical appointments: 03/24/22 with Cardiology and 06/12/22 with PCP as evidenced by provider documentation        through collaboration with RN Care manager, provider, and care team.   Interventions: Inter-disciplinary care team collaboration (see longitudinal plan of care) Evaluation of current treatment plan related to  self management and patient's adherence to plan as established by provider   Weight Loss:  (Status: Goal on track: NO.)  Advised patient to discuss with primary care provider options regarding weight management;  Provided verbal and/or written education to patient re: provider recommended life style modifications;  Reviewed recommended dietary changes: avoid fad diets, make small/incremental dietary and exercise changes, eat at the table and avoid eating in front of the TV, plan management of cravings, monitor  snacking and cravings in food diary; Reviewed enhanced benefits provided by [facility], advised patient to contact member services [phone] Discussed weight loss goals, patient's ultimate goal is to get down to #200, advised patient to start with smaller goals and work towards the larger goal Congratulated patient on smoking cessation  Reviewed upcoming appointment: PCP on 01/21/22 Advised patient to choose healthy snacks or go for a walk when he desires to smoke, examples provided  Patient Goals/Self-Care Activities: Take medications as prescribed   Attend all scheduled provider appointments Call provider office for new concerns or questions  Work toward #20 weight loss        Follow Up:  Patient agrees to Care Plan and Follow-up.  Plan: The Managed Medicaid care management team will reach out to the patient again over the next 60 days.  Date/time of next scheduled RN care management/care coordination outreach:  05/14/22 @ 1:15pm  Lurena Joiner RN, BSN [place]  [employer] manager

## 2022-03-18 NOTE — Progress Notes (Deleted)
Cardiology Office Note:    Date:  03/18/2022   ID:  [name], DOB [birth year], MRN [id number]  PCP:  Waldon Reining, NP  [facility] Cardiologist:  Zaira Iacovelli [place], MD  [place] Electrophysiologist:  None   Chief Complaint:  follow up CAD  History of Present Illness:    Curtis Marshall is a 51 y.o. male with a hx of hypertension and tobacco abuse patient here for hospital follow-up.  The patient was initially presented to [facility] for evaluation of chest pain on 10/15/21.  EKG showed ST elevation in anterior leads.  Code STEMI activated and transferred to [facility].  Upon arrival, patient was chest pain-free with normalization of ST segment.  Emergent cardiac cath showed mild nonobstructive disease otherwise no culprit lesion.  Moderately elevated LVEDP.  It was felt the etiology of ACS could be due to vasospasm, resolved thrombus or hypertension.  Recommended medical management with dual antiplatelet therapy.  Troponin peaked at 340. Echocardiogram with preserved LV function.  Added statin and Imdur.  Home propranolol changed to Toprol-XL.  He was seen in follow up on 11/01/21 and reported new R sided weakness persistently and intermittent slurred speech since discharge.  No headache or blurry vision.  He went to work 1 day after discharge and had sudden onset severe dizziness with slurred speech and brief syncope for few seconds.  No other episode.  He denies chest pain or shortness of breath.  Reports compliance with medication.  He has stopped smoking tobacco since discharge. There was concern that he may have had a subacute CVA. Stat CT was negative except for ? Arachnoid cyst. MRI/MRA of the head and neck was also OK. Referred to Neuro. Scheduled on 11/20/21.   On follow up today he is doing well from a cardiac standpoint. He has no recurrent chest pain or SOB. Still notes some right sided weakness, slurred speech and difficulty with word finding. He has not  resumed smoking.   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    Past Surgical History:  Procedure Laterality Date   LEFT HEART CATH AND CORONARY ANGIOGRAPHY N/A 10/15/2021   Procedure: LEFT HEART CATH AND CORONARY ANGIOGRAPHY;  Surgeon: Martinique, Milca Sytsma M, MD;  Location: [place] [facility];  Service: Cardiovascular;  Laterality: N/A;    Current Medications: No outpatient medications have been marked as taking for the 03/24/22 encounter (Appointment) with Martinique, Indiya Izquierdo M, MD.     Allergies:   Pepto-bismol [bismuth subsalicylate]   Social History   Socioeconomic History   Marital status: Single    Spouse name: Not on file   Number of children: Not on file   Years of education: Not on file   Highest education level: Not on file  Occupational History   Occupation: works at [employer]  Tobacco Use   Smoking status: Former    Packs/day: 1.00    Types: Cigarettes   Smokeless tobacco: Never  Substance and Sexual Activity   Alcohol use: Never   Drug use: Never   Sexual activity: Not on file  Other Topics Concern   Not on file  Social History Narrative   Not on file   Social Determinants of Health   Financial Resource Strain: Not on file  Food Insecurity: No Food Insecurity (12/25/2021)  Hunger Vital Sign    Worried About Running Out of Food in the Last Year: Never true    Ran Out of Food in the Last Year: Never true  Transportation Needs: No Transportation Needs (12/25/2021)   PRAPARE - Hydrologist (Medical): No    Lack of Transportation (Non-Medical): No  Physical Activity: Sufficiently Active (12/25/2021)   Exercise Vital Sign    Days of Exercise per Week: 7 days    Minutes of Exercise per Session: 60 min  Stress: Not on file  Social Connections: Not on file      Family History: The patient's family history includes Heart disease in his father.    ROS:   Please see the history of present illness.    All other systems reviewed and are negative.   EKGs/Labs/Other Studies Reviewed:    The following studies were reviewed today:  LEFT HEART CATH AND CORONARY ANGIOGRAPHY 6/6/223 Ost LAD to Mid LAD lesion is 20% stenosed.   The left ventricular systolic function is normal.   LV end diastolic pressure is moderately elevated.   The left ventricular ejection fraction is 55-65% by visual estimate.   Mild nonobstructive CAD. No culprit lesion seen  Moderately elevated LVEDP Normal LV function   Plan: etiology of acute syndrome could be vasospasm, resolved thrombus or HTN. Recommend medical management with DAPT, BP control and smoking cessation. Will check Echo.    Echo 10/15/21 1. Left ventricular ejection fraction, by estimation, is 55 to 60%. The  left ventricle has normal function. The left ventricle has no regional  wall motion abnormalities. There is moderate concentric left ventricular  hypertrophy. Left ventricular  diastolic parameters were normal.   2. Right ventricular systolic function is normal. The right ventricular  size is normal. Tricuspid regurgitation signal is inadequate for assessing  PA pressure.   3. The mitral valve is grossly normal. Trivial mitral valve  regurgitation. No evidence of mitral stenosis.   4. The aortic valve is grossly normal. Aortic valve regurgitation is not  visualized. No aortic stenosis is present.   5. The inferior vena cava is dilated in size with >50% respiratory  variability, suggesting right atrial pressure of 8 mmHg.   EKG:  EKG is not ordered today.    Recent Labs: 10/15/2021: ALT 20 10/16/2021: BUN 14; Creatinine, Ser 0.76; Hemoglobin 13.9; Platelets 306; Potassium 3.8; Sodium 135  Recent Lipid Panel    Component Value Date/Time   CHOL 132 10/16/2021 0228   TRIG 142 10/16/2021 0228   HDL  30 (L) 10/16/2021 0228   CHOLHDL 4.4 10/16/2021 0228   VLDL 28 10/16/2021 0228   LDLCALC 74 10/16/2021 0228    Physical Exam:    VS:  There were no vitals taken for this visit.    Wt Readings from Last 3 Encounters:  11/20/21 281 lb (127.5 kg)  11/18/21 281 lb 6.4 oz (127.6 kg)  11/01/21 277 lb (125.6 kg)     GEN:  Well nourished, well developed in no acute distress HEENT: Normal NECK: No JVD; No carotid bruits LYMPHATICS: No lymphadenopathy CARDIAC: RRR, no murmurs, rubs, gallops RESPIRATORY:  Clear to auscultation without rales, wheezing or rhonchi  ABDOMEN: Soft, non-tender, non-distended MUSCULOSKELETAL:  No edema; No deformity  SKIN: Warm and dry NEUROLOGIC:  Alert and oriented x 3. Some RUE weakness, difficulty finding words. PSYCHIATRIC:  Normal affect   ASSESSMENT AND PLAN:    Anterior STEMI/ unstable angina. ST elevation resolved by presentation to  cath lab.  Cath with mild nonobstructive CAD.  It was felt that ACS could be due to vasospasm, resolved thrombus or hypertension.  Plan for dual antiplatelet therapy with aspirin and Brilinta for one year for ACS. Continue nitrates and beta blocker. Would consider stopping nitrates next visit if doing well.   2.  Hypertension -Blood pressure now well controlled.   3.  Slurred speech and right-sided weakness. Difficulty with finding words.  Patient reported intermittent slurred speech and right-sided weakness (lower extremity> upper extremity) since discharge.  Also 1 severe episode of dizziness and brief syncope.  Stat CT head and MRI/MRA negative. Follow up with Neuro this week.  4. Tobacco abuse. Congratulated on smoking cessation  Will arrange follow up in 4 months with fasting lab.    Medication Adjustments/Labs and Tests Ordered: Current medicines are reviewed at length with the patient today.  Concerns regarding medicines are outlined above.  No orders of the defined types were placed in this encounter.  No  orders of the defined types were placed in this encounter.   There are no Patient Instructions on file for this visit.   Signed, Jenniah Bhavsar Martinique, MD  03/18/2022 7:52 AM    [facility]

## 2022-03-24 ENCOUNTER — Ambulatory Visit: Payer: Medicaid Other | Admitting: Cardiology

## 2022-05-14 ENCOUNTER — Encounter: Payer: Self-pay | Admitting: *Deleted

## 2022-05-14 ENCOUNTER — Other Ambulatory Visit: Payer: Medicaid Other | Admitting: *Deleted

## 2022-05-14 NOTE — Patient Instructions (Signed)
Visit Information  Mr. Rogus was given information about Medicaid Managed Care team care coordination services as a part of their Healthy [facility] Medicaid benefit. [name] verbally consented to engagement with the [facility] Managed Care team.   If you are experiencing a medical emergency, please call 911 or report to your local emergency department or urgent care.   If you have a non-emergency medical problem during routine business hours, please contact your provider's office and ask to speak with a nurse.   For questions related to your Healthy [facility] health plan, please call: [phone] or visit the homepage here: [email or web address]  If you would like to schedule transportation through your Healthy [facility] plan, please call the following number at least 2 days in advance of your appointment: [phone]  For information about your ride after you set it up, call Ride Assist at [phone]. Use this number to activate a Will Call pickup, or if your transportation is late for a scheduled pickup. Use this number, too, if you need to make a change or cancel a previously scheduled reservation.  If you need transportation services right away, call [phone]. The after-hours call center is staffed 24 hours to handle ride assistance and urgent reservation requests (including discharges) 365 days a year. Urgent trips include sick visits, hospital discharge requests and life-sustaining treatment.  Call the [place] at [phone], at any time, 24 hours a day, 7 days a week. If you are in danger or need immediate medical attention call 911.  If you would like help to quit smoking, call 1-800-QUIT-NOW (220) 666-8382) OR Espaol: 1-855-Djelo-Ya (1-740-814-4818) o para ms informacin haga clic aqu or Text READY to 200-400 to register via text  Mr. Lusty,   Please see education materials related to pain provided by  MyChart link.  Patient verbalizes understanding of instructions and care plan provided today and agrees to view in [place]. Active MyChart status and patient understanding of how to access instructions and care plan via MyChart confirmed with patient.     Telephone follow up appointment with Managed Medicaid care management team member scheduled for:06/13/22 @ 2:30pm  Lurena Joiner RN, BSN [place] RN Care Coordinator   Following is a copy of your plan of care:  Care Plan : RN Care Manager Plan of Care  Updates made by Melissa Montane, RN since 05/14/2022 12:00 AM     Problem: Health Management needs related to Improving Cardiovascular Health      Long-Range Goal: Development of Plan of Care to address Health Management needs related to Improving Cardiovascular Health   Start Date: 12/25/2021  Expected End Date: 05/16/2022  Priority: High  Note:   Current Barriers:  Knowledge Deficits related to plan of care for management of Weight Loss Mr. Adduci continues to work on improving his heart health with smoking cessation. He has not had a chance to follow up with member benefits for weight loss from [facility]. He attended Pain Management and will begin PT for neck pain next week. He is scheduled with Oncology for elevated Ferritin level.  RNCM Clinical Goal(s):  Patient will verbalize understanding of plan for management of Weight Loss as evidenced by patient reports take all medications exactly as prescribed and will call provider for medication related questions as evidenced by patient reports and documentation in EMR    attend all scheduled medical appointments: 06/02/22 with Oncology and 06/12/22 with PCP as evidenced by provider documentation  through collaboration with Consulting civil engineer, provider, and care team.   Interventions: Inter-disciplinary care team collaboration (see longitudinal plan of care) Evaluation of current treatment plan related to   self management and patient's adherence to plan as established by provider   Pain Interventions:  (Status:  New goal.) Long Term Goal Pain assessment performed Medications reviewed-Patient did not have medications with him today, unable to perform thorough medication review Reviewed provider established plan for pain management Discussed importance of adherence to all scheduled medical appointments Counseled on the importance of reporting any/all new or changed pain symptoms or management strategies to pain management provider Discussed use of relaxation techniques and/or diversional activities to assist with pain reduction (distraction, imagery, relaxation, massage, acupressure, TENS, heat, and cold application Assessed social determinant of health barriers Advised patient to attend PT and work on provided exercises at home   Weight Loss:  (Status: Goal on track: NO.)  Advised patient to discuss with primary care provider options regarding weight management;  Provided verbal and/or written education to patient re: provider recommended life style modifications;  Reviewed recommended dietary changes: avoid fad diets, make small/incremental dietary and exercise changes, eat at the table and avoid eating in front of the TV, plan management of cravings, monitor snacking and cravings in food diary; Reviewed enhanced benefits provided by [facility], advised patient to contact member services [phone] Discussed weight loss goals, patient's ultimate goal is to get down to #200, advised patient to start with smaller goals and work towards the larger goal  Reviewed upcoming appointment: PCP Oncology 06/02/22, PCP 06/12/22 and Cardiology 06/26/22  Advised patient to choose healthy snacks or go for a walk when he desires to smoke, examples provided  Patient Goals/Self-Care Activities: Take medications as prescribed   Attend all scheduled provider appointments Call provider office for new concerns  or questions  Work toward #20 weight loss

## 2022-05-14 NOTE — Patient Outreach (Signed)
Medicaid Managed Care   Nurse Care Manager Note  05/14/2022 Name:  Curtis Marshall MRN:  [id number] DOB:  June 20, 1970  Curtis Marshall is an 52 y.o. year old male who is a primary patient of Berwanger, Rande Brunt, NP.  The [facility] Managed Care Coordination team was consulted for assistance with:    Pain  Weight Loss  Curtis Marshall was given information about Medicaid Managed Care Coordination team services today. Curtis Marshall Patient agreed to services and verbal consent obtained.  Engaged with patient by telephone for follow up visit in response to provider referral for case management and/or care coordination services.   Assessments/Interventions:  Review of past medical history, allergies, medications, health status, including review of consultants reports, laboratory and other test data, was performed as part of comprehensive evaluation and provision of chronic care management services.  SDOH (Social Determinants of Health) assessments and interventions performed: SDOH Interventions    Flowsheet Row Patient Outreach Telephone from 05/14/2022 in [place] Patient Outreach Telephone from 12/25/2021 in [place] Coordination  SDOH Interventions    Food Insecurity Interventions Intervention Not Indicated Intervention Not Indicated  Housing Interventions Intervention Not Indicated Intervention Not Indicated  Transportation Interventions Intervention Not Indicated Intervention Not Indicated  Utilities Interventions Intervention Not Indicated --  Physical Activity Interventions -- Intervention Not Indicated       Care Plan  Allergies  Allergen Reactions   Pepto-Bismol [Bismuth Subsalicylate] Nausea And Vomiting    Medications Reviewed Today     Reviewed by Melissa Montane, RN (Registered Nurse) on 05/14/22 at 104  Med List Status: <None>   Medication Order Taking? Sig Documenting Provider Last Dose Status Informant   albuterol (VENTOLIN HFA) 108 (90 Base) MCG/ACT inhaler [id number] No Inhale 2 puffs into the lungs every 6 (six) hours as needed for wheezing or shortness of breath. [provider] Taking Active Self  aspirin EC 81 MG tablet [id number] No Take 1 tablet (81 mg total) by mouth daily. Swallow whole. Leanor Kail, PA Taking Active   atorvastatin (LIPITOR) 40 MG tablet [id number] No TAKE ONE (1) TABLET BY MOUTH EVERY DAY Bhagat, Bhavinkumar, PA Taking Active   HYDROcodone-acetaminophen (NORCO/VICODIN) 5-325 MG tablet [id number] No Take 1 tablet by mouth 4 (four) times daily as needed. [provider] Taking Active   isosorbide mononitrate (IMDUR) 30 MG 24 hr tablet [id number] No TAKE ONE (1) TABLET BY MOUTH EVERY DAY Bhagat, Bhavinkumar, PA Taking Active   lisinopril (ZESTRIL) 20 MG tablet [id number] No Take 1 tablet (20 mg total) by mouth daily. Leanor Kail, PA Taking Active   meclizine (ANTIVERT) 25 MG tablet [id number] No Take 25 mg by mouth 3 (three) times daily as needed for dizziness. [provider] Taking Active Self  metoprolol succinate (TOPROL-XL) 25 MG 24 hr tablet [id number] No TAKE ONE (1) TABLET BY MOUTH EVERY DAY Bhagat, Bhavinkumar, PA Taking Active   nitroGLYCERIN (NITROSTAT) 0.4 MG SL tablet [id number] No Place 1 tablet (0.4 mg total) under the tongue every 5 (five) minutes x 3 doses as needed for chest pain. Leanor Kail, PA Taking Active            Med Note (CAMPOS, ROWEENA A   Mon Nov 18, 2021  9:20 AM) PRN  propranolol ER (INDERAL LA) 60 MG 24 hr capsule [id number] No Take by mouth as needed. Per patient taking as needed for migraine headaches [provider] Taking Active   rizatriptan (MAXALT) 10 MG tablet  [id number] No Take 10 mg by mouth daily as needed for migraine. [provider] Taking Active Self  ticagrelor (BRILINTA) 90 MG TABS tablet [id number] No Take 1 tablet (90 mg total) by mouth 2 (two) times daily. Manson Passey, PA Taking Active   topiramate (TOPAMAX) 25 MG tablet [id number] No at bedtime. [provider] Taking Active             Patient Active Problem List   Diagnosis Date Noted   Unstable angina (HCC)    Secondary hypertension 07/23/2016    Conditions to be addressed/monitored per PCP order:   Pain and Weight loss  Care Plan : RN Care Manager Plan of Care  Updates made by [name], RN since 05/14/2022 12:00 AM     Problem: Health Management needs related to Improving Cardiovascular Health      Long-Range Goal: Development of Plan of Care to address Health Management needs related to Improving Cardiovascular Health   Start Date: 12/25/2021  Expected End Date: 05/16/2022  Priority: High  Note:   Current Barriers:  Knowledge Deficits related to plan of care for management of Weight Loss Curtis Marshall continues to work on improving his heart health with smoking cessation. He has not had a chance to follow up with member benefits for weight loss from [facility]. He attended Pain Management and will begin PT for neck pain next week. He is scheduled with Oncology for elevated Ferritin level.  RNCM Clinical Goal(s):  Patient will verbalize understanding of plan for management of Weight Loss as evidenced by patient reports take all medications exactly as prescribed and will call provider for medication related questions as evidenced by patient reports and documentation in EMR    attend all scheduled medical appointments: 06/02/22 with Oncology and 06/12/22 with PCP as evidenced by provider documentation        through collaboration with RN Care manager, provider, and care team.   Interventions: Inter-disciplinary care team collaboration (see longitudinal plan of care) Evaluation of current treatment plan related to  self management and patient's adherence to plan as established by provider   Pain Interventions:  (Status:  New goal.) Long Term Goal Pain assessment  performed Medications reviewed-Patient did not have medications with him today, unable to perform thorough medication review Reviewed provider established plan for pain management Discussed importance of adherence to all scheduled medical appointments Counseled on the importance of reporting any/all new or changed pain symptoms or management strategies to pain management provider Discussed use of relaxation techniques and/or diversional activities to assist with pain reduction (distraction, imagery, relaxation, massage, acupressure, TENS, heat, and cold application Assessed social determinant of health barriers Advised patient to attend PT and work on provided exercises at home   Weight Loss:  (Status: Goal on track: NO.)  Advised patient to discuss with primary care provider options regarding weight management;  Provided verbal and/or written education to patient re: provider recommended life style modifications;  Reviewed recommended dietary changes: avoid fad diets, make small/incremental dietary and exercise changes, eat at the table and avoid eating in front of the TV, plan management of cravings, monitor snacking and cravings in food diary; Reviewed enhanced benefits provided by [facility], advised patient to contact member services [phone] Discussed weight loss goals, patient's ultimate goal is to get down to #200, advised patient to start with smaller goals and work towards the larger goal  Reviewed upcoming appointment: PCP Oncology 06/02/22, PCP 06/12/22 and Cardiology 06/26/22  Advised patient to  choose healthy snacks or go for a walk when he desires to smoke, examples provided  Patient Goals/Self-Care Activities: Take medications as prescribed   Attend all scheduled provider appointments Call provider office for new concerns or questions  Work toward #20 weight loss        Follow Up:  Patient agrees to Care Plan and Follow-up.  Plan: The Managed Medicaid care  management team will reach out to the patient again over the next 30 days.  Date/time of next scheduled RN care management/care coordination outreach:  06/13/22 @ 2:30pm  Lurena Joiner RN, BSN [place] RN Care Coordinator

## 2022-05-19 DIAGNOSIS — Z0271 Encounter for disability determination: Secondary | ICD-10-CM

## 2022-06-13 ENCOUNTER — Other Ambulatory Visit: Payer: Medicaid Other | Admitting: *Deleted

## 2022-06-13 NOTE — Patient Instructions (Signed)
Visit Information  Curtis Marshall was given information about Medicaid Managed Care team care coordination services as a part of their Healthy [facility] Medicaid benefit. Curtis Marshall verbally consented to engagement with the [facility] Managed Care team.   If you are experiencing a medical emergency, please call 911 or report to your local emergency department or urgent care.   If you have a non-emergency medical problem during routine business hours, please contact your provider's office and ask to speak with a nurse.   For questions related to your Healthy [facility] health plan, please call: [phone] or visit the homepage here: [email or web address]  If you would like to schedule transportation through your Healthy [facility] plan, please call the following number at least 2 days in advance of your appointment: [phone]  For information about your ride after you set it up, call Ride Assist at [phone]. Use this number to activate a Will Call pickup, or if your transportation is late for a scheduled pickup. Use this number, too, if you need to make a change or cancel a previously scheduled reservation.  If you need transportation services right away, call [phone]. The after-hours call center is staffed 24 hours to handle ride assistance and urgent reservation requests (including discharges) 365 days a year. Urgent trips include sick visits, hospital discharge requests and life-sustaining treatment.  Call the [place] at [phone], at any time, 24 hours a day, 7 days a week. If you are in danger or need immediate medical attention call 911.  If you would like help to quit smoking, call 1-800-QUIT-NOW 639-171-2051) OR Espaol: 1-855-Djelo-Ya (5-427-062-3762) o para ms informacin haga clic aqu or Text READY to 200-400 to register via text  Curtis Marshall,   Please see education materials related to weight loss  provided by MyChart link.  Patient verbalizes understanding of instructions and care plan provided today and agrees to view in [place]. Active MyChart status and patient understanding of how to access instructions and care plan via MyChart confirmed with patient.     Telephone follow up appointment with Managed Medicaid care management team member scheduled for:07/17/22 @ 2:30pm  Lurena Joiner RN, BSN [place] RN Care Coordinator   Following is a copy of your plan of care:  Care Plan : RN Care Manager Plan of Care  Updates made by Melissa Montane, RN since 06/13/2022 12:00 AM     Problem: Health Management needs related to Improving Cardiovascular Health      Long-Range Goal: Development of Plan of Care to address Health Management needs related to Improving Cardiovascular Health   Start Date: 12/25/2021  Expected End Date: 08/08/2022  Priority: High  Note:   Current Barriers:  Knowledge Deficits related to plan of care for management of Weight Loss Curtis Marshall continues to desire to lose weight. He eats one meal a day, most days. He is attending PT for neck pain. Oncology follow up on 06/25/22 for elevated ferritin.  RNCM Clinical Goal(s):  Patient will verbalize understanding of plan for management of Weight Loss as evidenced by patient reports take all medications exactly as prescribed and will call provider for medication related questions as evidenced by patient reports and documentation in EMR    attend all scheduled medical appointments: 06/25/22 with Oncology, 06/26/22 with Cardiology, and PCP 09/10/22 as evidenced by provider documentation        through collaboration with RN Care manager, provider, and care team.   Interventions: Inter-disciplinary care  team collaboration (see longitudinal plan of care) Evaluation of current treatment plan related to  self management and patient's adherence to plan as established by provider   Pain Interventions:   (Status:  Goal on track:  Yes.) Long Term Goal Pain assessment performed Medications reviewed-Patient unsure of 2 of his medications, advised him to request refills with Pharmacy Reviewed provider established plan for pain management Discussed importance of adherence to all scheduled medical appointments Counseled on the importance of reporting any/all new or changed pain symptoms or management strategies to pain management provider Discussed use of relaxation techniques and/or diversional activities to assist with pain reduction (distraction, imagery, relaxation, massage, acupressure, TENS, heat, and cold application Assessed social determinant of health barriers Advised patient to attend PT and work on provided exercises at home Discussed pending pain management referral to [place]   Weight Loss:  (Status: Goal on track: NO.)  Advised patient to discuss with primary care provider options regarding weight management;  Provided verbal and/or written education to patient re: provider recommended life style modifications;  Reviewed recommended dietary changes: avoid fad diets, make small/incremental dietary and exercise changes, eat at the table and avoid eating in front of the TV, plan management of cravings, monitor snacking and cravings in food diary; Reviewed enhanced benefits provided by [facility], advised patient to contact member services [phone] Discussed weight loss goals, patient's ultimate goal is to get down to #200, advised patient to start with smaller goals and work towards the larger goal  Reviewed upcoming appointment: PCP 09/10/22, Oncology 06/25/22,  Cardiology 06/26/22  Advised patient to choose healthy snacks or go for a walk when he desires to smoke, examples provided Advised patient to eat 3 small meals a day, including protein with meals and snacks Advised patient to discuss weight loss goals with PCP, request a referral to Nutrition  Patient Goals/Self-Care  Activities: Take medications as prescribed   Attend all scheduled provider appointments Call provider office for new concerns or questions  Work toward #20 weight loss

## 2022-06-13 NOTE — Patient Outreach (Signed)
Medicaid Managed Care   Nurse Care Manager Note  06/13/2022 Name:  Curtis Marshall MRN:  [id number] DOB:  January 28, 1971  Curtis Marshall is an 52 y.o. year old male who is a primary Marshall of Berwanger, Rande Brunt, NP.  The [facility] Managed Care Coordination team was consulted for assistance with:    Pain and Weight loss  Curtis Marshall was given information about Medicaid Managed Care Coordination team services today. Curtis Marshall agreed to services and verbal consent obtained.  Engaged with Marshall by telephone for follow up visit in response to provider referral for case management and/or care coordination services.   Assessments/Interventions:  Review of past medical history, allergies, medications, health status, including review of consultants reports, laboratory and other test data, was performed as part of comprehensive evaluation and provision of chronic care management services.  SDOH (Social Determinants of Health) assessments and interventions performed: SDOH Interventions    Flowsheet Row Marshall Outreach Telephone from 05/14/2022 in [place] Marshall Outreach Telephone from 12/25/2021 in [place] Coordination  SDOH Interventions    Food Insecurity Interventions Intervention Not Indicated Intervention Not Indicated  Housing Interventions Intervention Not Indicated Intervention Not Indicated  Transportation Interventions Intervention Not Indicated Intervention Not Indicated  Utilities Interventions Intervention Not Indicated --  Physical Activity Interventions -- Intervention Not Indicated       Care Plan  Allergies  Allergen Reactions   Pepto-Bismol [Bismuth Subsalicylate] Nausea And Vomiting    Medications Reviewed Today     Reviewed by Melissa Montane, RN (Registered Nurse) on 06/13/22 at [place] List Status: <None>   Medication Order Taking? Sig Documenting Provider Last Dose Status Informant   albuterol (VENTOLIN HFA) 108 (90 Base) MCG/ACT inhaler [id number] Yes Inhale 2 puffs into the lungs every 6 (six) hours as needed for wheezing or shortness of breath. [provider] Taking Active Self  aspirin EC 81 MG tablet [id number] Yes Take 1 tablet (81 mg total) by mouth daily. Swallow whole. Dougherty, Crista Luria, PA Taking Active   atorvastatin (LIPITOR) 40 MG tablet [id number] Yes TAKE ONE (1) TABLET BY MOUTH EVERY DAY Bhagat, Bhavinkumar, PA Taking Active   HYDROcodone-acetaminophen (NORCO/VICODIN) 5-325 MG tablet [id number] Yes Take 1 tablet by mouth 4 (four) times daily as needed. [provider] Taking Active   isosorbide mononitrate (IMDUR) 30 MG 24 hr tablet [id number] No TAKE ONE (1) TABLET BY MOUTH EVERY DAY Bhagat, Bhavinkumar, PA Unknown Active   lisinopril (ZESTRIL) 20 MG tablet [id number] Yes Take 1 tablet (20 mg total) by mouth daily. Leanor Kail, PA Taking Active   meclizine (ANTIVERT) 25 MG tablet [id number] Yes Take 25 mg by mouth 3 (three) times daily as needed for dizziness. [provider] Taking Active Self  metoprolol succinate (TOPROL-XL) 25 MG 24 hr tablet [id number] No TAKE ONE (1) TABLET BY MOUTH EVERY DAY Bhagat, Bhavinkumar, PA Unknown Active   nitroGLYCERIN (NITROSTAT) 0.4 MG SL tablet [id number] Yes Place 1 tablet (0.4 mg total) under the tongue every 5 (five) minutes x 3 doses as needed for chest pain. Leanor Kail, PA Taking Active            Med Note (CAMPOS, ROWEENA A   Mon Nov 18, 2021  9:20 AM) PRN  propranolol ER (INDERAL LA) 60 MG 24 hr capsule [id number] Yes Take by mouth as needed. Per Marshall taking as needed for migraine headaches [provider] Taking Active   rizatriptan (MAXALT) 10 MG tablet  [id number] Yes Take 10 mg by mouth daily as needed for migraine. [provider] Taking Active Self  ticagrelor (BRILINTA) 90 MG TABS tablet [id number] Yes Take 1 tablet (90 mg total) by mouth 2 (two) times daily.  Leanor Kail, PA Taking Active   topiramate (TOPAMAX) 25 MG tablet [id number] Yes at bedtime. [provider] Taking Active             Marshall Active Problem List   Diagnosis Date Noted   Unstable angina ([place])    Secondary hypertension 07/23/2016    Conditions to be addressed/monitored per PCP order:   Pain and Weight loss  Care Plan : [place] of Care  Updates made by Melissa Montane, RN since 06/13/2022 12:00 AM     Problem: Health Management needs related to Improving Cardiovascular Health      Long-Range Goal: Development of Plan of Care to address Health Management needs related to Improving Cardiovascular Health   Start Date: 12/25/2021  Expected End Date: 08/08/2022  Priority: High  Note:   Current Barriers:  Knowledge Deficits related to plan of care for management of Weight Loss Curtis Marshall continues to desire to lose weight. He eats one meal a day, most days. He is attending PT for neck pain. Oncology follow up on 06/25/22 for elevated ferritin.  RNCM Clinical Goal(s):  Marshall will verbalize understanding of plan for management of Weight Loss as evidenced by Marshall reports take all medications exactly as prescribed and will call provider for medication related questions as evidenced by Marshall reports and documentation in EMR    attend all scheduled medical appointments: 06/25/22 with Oncology, 06/26/22 with Cardiology, and PCP 09/10/22 as evidenced by provider documentation        through collaboration with RN Care manager, provider, and care team.   Interventions: Inter-disciplinary care team collaboration (see longitudinal plan of care) Evaluation of current treatment plan related to  self management and Marshall's adherence to plan as established by provider   Pain Interventions:  (Status:  Goal on track:  Yes.) Long Term Goal Pain assessment performed Medications reviewed-Marshall unsure of 2 of his medications, advised him to request  refills with Pharmacy Reviewed provider established plan for pain management Discussed importance of adherence to all scheduled medical appointments Counseled on the importance of reporting any/all new or changed pain symptoms or management strategies to pain management provider Discussed use of relaxation techniques and/or diversional activities to assist with pain reduction (distraction, imagery, relaxation, massage, acupressure, TENS, heat, and cold application Assessed social determinant of health barriers Advised Marshall to attend PT and work on provided exercises at home Discussed pending pain management referral to [place]   Weight Loss:  (Status: Goal on track: NO.)  Advised Marshall to discuss with primary care provider options regarding weight management;  Provided verbal and/or written education to Marshall re: provider recommended life style modifications;  Reviewed recommended dietary changes: avoid fad diets, make small/incremental dietary and exercise changes, eat at the table and avoid eating in front of the TV, plan management of cravings, monitor snacking and cravings in food diary; Reviewed enhanced benefits provided by [facility], advised Marshall to contact member services [phone] Discussed weight loss goals, Marshall's ultimate goal is to get down to #200, advised Marshall to start with smaller goals and work towards the larger goal  Reviewed upcoming appointment: PCP 09/10/22, Oncology 06/25/22,  Cardiology 06/26/22  Advised Marshall to choose healthy snacks or go for a walk when he  desires to smoke, examples provided Advised Marshall to eat 3 small meals a day, including protein with meals and snacks Advised Marshall to discuss weight loss goals with PCP, request a referral to Nutrition  Marshall Goals/Self-Care Activities: Take medications as prescribed   Attend all scheduled provider appointments Call provider office for new concerns or questions  Work toward  #20 weight loss        Follow Up:  Marshall agrees to Care Plan and Follow-up.  Plan: The Managed Medicaid care management team will reach out to the Marshall again over the next 30 days.  Date/time of next scheduled RN care management/care coordination outreach:  07/17/22 @ 2:30pm  Lurena Joiner RN, BSN [place] RN Care Coordinator

## 2022-06-20 NOTE — Progress Notes (Deleted)
Cardiology Office Note:    Date:  06/20/2022   ID:  Curtis Marshall, DOB [birth year], MRN [id number]  PCP:  Waldon Reining, NP  [facility] Cardiologist:  Trichelle Lehan Martinique, MD  [place] Electrophysiologist:  None   Chief Complaint:  follow up CAD  History of Present Illness:    Curtis Marshall is a 52 y.o. male with a hx of hypertension and tobacco abuse patient here for hospital follow-up.  The patient was initially presented to [facility] for evaluation of chest pain on 10/15/21.  EKG showed ST elevation in anterior leads.  Code STEMI activated and transferred to [facility].  Upon arrival, patient was chest pain-free with normalization of ST segment.  Emergent cardiac cath showed mild nonobstructive disease otherwise no culprit lesion.  Moderately elevated LVEDP.  It was felt the etiology of ACS could be due to vasospasm, resolved thrombus or hypertension.  Recommended medical management with dual antiplatelet therapy.  Troponin peaked at 340. Echocardiogram with preserved LV function.  Added statin and Imdur.  Home propranolol changed to Toprol-XL.  He was seen in follow up on 11/01/21 and reported new R sided weakness persistently and intermittent slurred speech since discharge.  No headache or blurry vision.  He went to work 1 day after discharge and had sudden onset severe dizziness with slurred speech and brief syncope for few seconds.  No other episode.  He denies chest pain or shortness of breath.  Reports compliance with medication.  He has stopped smoking tobacco since discharge. There was concern that he may have had a subacute CVA. Stat CT was negative except for ? Arachnoid cyst. MRI/MRA of the head and neck was also OK. Referred to Neuro. Scheduled on 11/20/21.   On follow up today he is doing well from a cardiac standpoint. He has no recurrent chest pain or SOB. Still notes some right sided weakness, slurred speech and difficulty with word finding. He has not  resumed smoking.   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    Past Surgical History:  Procedure Laterality Date   LEFT HEART CATH AND CORONARY ANGIOGRAPHY N/A 10/15/2021   Procedure: LEFT HEART CATH AND CORONARY ANGIOGRAPHY;  Surgeon: Martinique, Keron Neenan M, MD;  Location: [place] [facility];  Service: Cardiovascular;  Laterality: N/A;    Current Medications: No outpatient medications have been marked as taking for the 06/26/22 encounter (Appointment) with Martinique, Abbey Veith M, MD.     Allergies:   Pepto-bismol [bismuth subsalicylate]   Social History   Socioeconomic History   Marital status: Single    Spouse name: Not on file   Number of children: Not on file   Years of education: Not on file   Highest education level: Not on file  Occupational History   Occupation: works at [employer]  Tobacco Use   Smoking status: Former    Packs/day: 1.00    Types: Cigarettes   Smokeless tobacco: Never  Substance and Sexual Activity   Alcohol use: Never   Drug use: Never   Sexual activity: Not on file  Other Topics Concern   Not on file  Social History Narrative   Not on file   Social Determinants of Health   Financial Resource Strain: Not on file  Food Insecurity: No [place] (05/14/2022)  Hunger Vital Sign    Worried About Running Out of Food in the Last Year: Never true    Ran Out of Food in the Last Year: Never true  Transportation Needs: No Transportation Needs (05/14/2022)   PRAPARE - Hydrologist (Medical): No    Lack of Transportation (Non-Medical): No  Physical Activity: Sufficiently Active (12/25/2021)   Exercise Vital Sign    Days of Exercise per Week: 7 days    Minutes of Exercise per Session: 60 min  Stress: Not on file  Social Connections: Not on file      Family History: The patient's family history includes Heart disease in his father.    ROS:   Please see the history of present illness.    All other systems reviewed and are negative.   EKGs/Labs/Other Studies Reviewed:    The following studies were reviewed today:  LEFT HEART CATH AND CORONARY ANGIOGRAPHY 6/6/223 Ost LAD to Mid LAD lesion is 20% stenosed.   The left ventricular systolic function is normal.   LV end diastolic pressure is moderately elevated.   The left ventricular ejection fraction is 55-65% by visual estimate.   Mild nonobstructive CAD. No culprit lesion seen  Moderately elevated LVEDP Normal LV function   Plan: etiology of acute syndrome could be vasospasm, resolved thrombus or HTN. Recommend medical management with DAPT, BP control and smoking cessation. Will check Echo.    Echo 10/15/21 1. Left ventricular ejection fraction, by estimation, is 55 to 60%. The  left ventricle has normal function. The left ventricle has no regional  wall motion abnormalities. There is moderate concentric left ventricular  hypertrophy. Left ventricular  diastolic parameters were normal.   2. Right ventricular systolic function is normal. The right ventricular  size is normal. Tricuspid regurgitation signal is inadequate for assessing  PA pressure.   3. The mitral valve is grossly normal. Trivial mitral valve  regurgitation. No evidence of mitral stenosis.   4. The aortic valve is grossly normal. Aortic valve regurgitation is not  visualized. No aortic stenosis is present.   5. The inferior vena cava is dilated in size with >50% respiratory  variability, suggesting right atrial pressure of 8 mmHg.   EKG:  EKG is not ordered today.    Recent Labs: 10/15/2021: ALT 20 10/16/2021: BUN 14; Creatinine, Ser 0.76; Hemoglobin 13.9; Platelets 306; Potassium 3.8; Sodium 135  Recent Lipid Panel    Component Value Date/Time   CHOL 132 10/16/2021 0228   TRIG 142 10/16/2021 0228   HDL  30 (L) 10/16/2021 0228   CHOLHDL 4.4 10/16/2021 0228   VLDL 28 10/16/2021 0228   LDLCALC 74 10/16/2021 0228    Physical Exam:    VS:  There were no vitals taken for this visit.    Wt Readings from Last 3 Encounters:  11/20/21 281 lb (127.5 kg)  11/18/21 281 lb 6.4 oz (127.6 kg)  11/01/21 277 lb (125.6 kg)     GEN:  Well nourished, well developed in no acute distress HEENT: Normal NECK: No JVD; No carotid bruits LYMPHATICS: No lymphadenopathy CARDIAC: RRR, no murmurs, rubs, gallops RESPIRATORY:  Clear to auscultation without rales, wheezing or rhonchi  ABDOMEN: Soft, non-tender, non-distended MUSCULOSKELETAL:  No edema; No deformity  SKIN: Warm and dry NEUROLOGIC:  Alert and oriented x 3. Some RUE weakness, difficulty finding words. PSYCHIATRIC:  Normal affect   ASSESSMENT AND PLAN:    Anterior STEMI/ unstable angina. ST elevation resolved by presentation to  cath lab.  Cath with mild nonobstructive CAD.  It was felt that ACS could be due to vasospasm, resolved thrombus or hypertension.  Plan for dual antiplatelet therapy with aspirin and Brilinta for one year for ACS. Continue nitrates and beta blocker. Would consider stopping nitrates next visit if doing well.   2.  Hypertension -Blood pressure now well controlled.   3.  Slurred speech and right-sided weakness. Difficulty with finding words.  Patient reported intermittent slurred speech and right-sided weakness (lower extremity> upper extremity) since discharge.  Also 1 severe episode of dizziness and brief syncope.  Stat CT head and MRI/MRA negative. Follow up with Neuro this week.  4. Tobacco abuse. Congratulated on smoking cessation  Will arrange follow up in 4 months with fasting lab.    Medication Adjustments/Labs and Tests Ordered: Current medicines are reviewed at length with the patient today.  Concerns regarding medicines are outlined above.  No orders of the defined types were placed in this encounter.  No  orders of the defined types were placed in this encounter.   There are no Patient Instructions on file for this visit.   Signed, Valton Schwartz Martinique, MD  06/20/2022 12:40 PM    [place]

## 2022-06-23 NOTE — Progress Notes (Deleted)
Cardiology Office Note:    Date:  06/20/2022   ID:  Curtis Marshall, DOB 1971/01/17, MRN [id number]  PCP:  Curtis Reining, NP  [facility] Cardiologist:  Curtis Karapetyan Martinique, MD  [place] Electrophysiologist:  None   Chief Complaint:  follow up CAD  History of Present Illness:    Curtis Marshall is a 52 y.o. male with a hx of hypertension and tobacco abuse patient here for hospital follow-up.  The patient was initially presented to [facility] for evaluation of chest pain on 10/15/21.  EKG showed ST elevation in anterior leads.  Code STEMI activated and transferred to [facility].  Upon arrival, patient was chest pain-free with normalization of ST segment.  Emergent cardiac cath showed mild nonobstructive disease otherwise no culprit lesion.  Moderately elevated LVEDP.  It was felt the etiology of ACS could be due to vasospasm, resolved thrombus or hypertension.  Recommended medical management with dual antiplatelet therapy.  Troponin peaked at 340. Echocardiogram with preserved LV function.  Added statin and Imdur.  Home propranolol changed to Toprol-XL.  He was seen in follow up on 11/01/21 and reported new R sided weakness persistently and intermittent slurred speech since discharge.  No headache or blurry vision.  He went to work 1 day after discharge and had sudden onset severe dizziness with slurred speech and brief syncope for few seconds.  No other episode.  He denies chest pain or shortness of breath.  Reports compliance with medication.  He has stopped smoking tobacco since discharge. There was concern that he may have had a subacute CVA. Stat CT was negative except for ? Arachnoid cyst. MRI/MRA of the head and neck was also OK. Referred to Neuro. Scheduled on 11/20/21.   On follow up today he is doing well from a cardiac standpoint. He has no recurrent chest pain or SOB. Still notes some right sided weakness, slurred speech and difficulty with word finding. He has not  resumed smoking.   Past Medical History:  Diagnosis Date   Bronchitis    Hypertension    ST elevation myocardial infarction (STEMI) of anterior wall ([name]) 10/2021   Emergent cath with mild nonobstructive CAD.  Patient had MINOCA.    Past Surgical History:  Procedure Laterality Date   LEFT HEART CATH AND CORONARY ANGIOGRAPHY N/A 10/15/2021   Procedure: LEFT HEART CATH AND CORONARY ANGIOGRAPHY;  Surgeon: Marshall, Takita Riecke M, MD;  Location: [place] [facility];  Service: Cardiovascular;  Laterality: N/A;    Current Medications: No outpatient medications have been marked as taking for the 06/26/22 encounter (Appointment) with Marshall, Jacqulynn Shappell M, MD.     Allergies:   Pepto-bismol [bismuth subsalicylate]   Social History   Socioeconomic History   Marital status: Single    Spouse name: Not on file   Number of children: Not on file   Years of education: Not on file   Highest education level: Not on file  Occupational History   Occupation: works at [employer]  Tobacco Use   Smoking status: Former    Packs/day: 1.00    Types: Cigarettes   Smokeless tobacco: Never  Substance and Sexual Activity   Alcohol use: Never   Drug use: Never   Sexual activity: Not on file  Other Topics Concern   Not on file  Social History Narrative   Not on file   Social Determinants of Health   Financial Resource Strain: Not on file  Food Insecurity: No [place] (05/14/2022)  Hunger Vital Sign    Worried About Running Out of Food in the Last Year: Never true    Ran Out of Food in the Last Year: Never true  Transportation Needs: No Transportation Needs (05/14/2022)   PRAPARE - Hydrologist (Medical): No    Lack of Transportation (Non-Medical): No  Physical Activity: Sufficiently Active (12/25/2021)   Exercise Vital Sign    Days of Exercise per Week: 7 days    Minutes of Exercise per Session: 60 min  Stress: Not on file  Social Connections: Not on file      Family History: The patient's family history includes Heart disease in his father.    ROS:   Please see the history of present illness.    All other systems reviewed and are negative.   EKGs/Labs/Other Studies Reviewed:    The following studies were reviewed today:  LEFT HEART CATH AND CORONARY ANGIOGRAPHY 6/6/223 Ost LAD to Mid LAD lesion is 20% stenosed.   The left ventricular systolic function is normal.   LV end diastolic pressure is moderately elevated.   The left ventricular ejection fraction is 55-65% by visual estimate.   Mild nonobstructive CAD. No culprit lesion seen  Moderately elevated LVEDP Normal LV function   Plan: etiology of acute syndrome could be vasospasm, resolved thrombus or HTN. Recommend medical management with DAPT, BP control and smoking cessation. Will check Echo.    Echo 10/15/21 1. Left ventricular ejection fraction, by estimation, is 55 to 60%. The  left ventricle has normal function. The left ventricle has no regional  wall motion abnormalities. There is moderate concentric left ventricular  hypertrophy. Left ventricular  diastolic parameters were normal.   2. Right ventricular systolic function is normal. The right ventricular  size is normal. Tricuspid regurgitation signal is inadequate for assessing  PA pressure.   3. The mitral valve is grossly normal. Trivial mitral valve  regurgitation. No evidence of mitral stenosis.   4. The aortic valve is grossly normal. Aortic valve regurgitation is not  visualized. No aortic stenosis is present.   5. The inferior vena cava is dilated in size with >50% respiratory  variability, suggesting right atrial pressure of 8 mmHg.   EKG:  EKG is not ordered today.    Recent Labs: 10/15/2021: ALT 20 10/16/2021: BUN 14; Creatinine, Ser 0.76; Hemoglobin 13.9; Platelets 306; Potassium 3.8; Sodium 135  Recent Lipid Panel    Component Value Date/Time   CHOL 132 10/16/2021 0228   TRIG 142 10/16/2021 0228   HDL  30 (L) 10/16/2021 0228   CHOLHDL 4.4 10/16/2021 0228   VLDL 28 10/16/2021 0228   LDLCALC 74 10/16/2021 0228    Physical Exam:    VS:  There were no vitals taken for this visit.    Wt Readings from Last 3 Encounters:  11/20/21 281 lb (127.5 kg)  11/18/21 281 lb 6.4 oz (127.6 kg)  11/01/21 277 lb (125.6 kg)     GEN:  Well nourished, well developed in no acute distress HEENT: Normal NECK: No JVD; No carotid bruits LYMPHATICS: No lymphadenopathy CARDIAC: RRR, no murmurs, rubs, gallops RESPIRATORY:  Clear to auscultation without rales, wheezing or rhonchi  ABDOMEN: Soft, non-tender, non-distended MUSCULOSKELETAL:  No edema; No deformity  SKIN: Warm and dry NEUROLOGIC:  Alert and oriented x 3. Some RUE weakness, difficulty finding words. PSYCHIATRIC:  Normal affect   ASSESSMENT AND PLAN:    Anterior STEMI/ unstable angina. ST elevation resolved by presentation to  cath lab.  Cath with mild nonobstructive CAD.  It was felt that ACS could be due to vasospasm, resolved thrombus or hypertension.  Plan for dual antiplatelet therapy with aspirin and Brilinta for one year for ACS. Continue nitrates and beta blocker. Would consider stopping nitrates next visit if doing well.   2.  Hypertension -Blood pressure now well controlled.   3.  Slurred speech and right-sided weakness. Difficulty with finding words.  Patient reported intermittent slurred speech and right-sided weakness (lower extremity> upper extremity) since discharge.  Also 1 severe episode of dizziness and brief syncope.  Stat CT head and MRI/MRA negative. Follow up with Neuro this week.  4. Tobacco abuse. Congratulated on smoking cessation  Will arrange follow up in 4 months with fasting lab.    Medication Adjustments/Labs and Tests Ordered: Current medicines are reviewed at length with the patient today.  Concerns regarding medicines are outlined above.  No orders of the defined types were placed in this encounter.  No  orders of the defined types were placed in this encounter.   There are no Patient Instructions on file for this visit.   Signed, Wafa Martes Martinique, MD  06/20/2022 12:40 PM    [place]

## 2022-06-26 ENCOUNTER — Telehealth: Payer: Self-pay

## 2022-06-26 ENCOUNTER — Ambulatory Visit: Payer: Medicaid Other | Admitting: Cardiology

## 2022-06-26 NOTE — Telephone Encounter (Signed)
Called patient to get him rescheduled for 2/15 appointment, provider is sick

## 2022-07-09 ENCOUNTER — Ambulatory Visit: Payer: Medicaid Other | Admitting: Internal Medicine

## 2022-07-10 ENCOUNTER — Ambulatory Visit: Payer: Medicaid Other | Attending: Cardiology | Admitting: Internal Medicine

## 2022-07-17 ENCOUNTER — Encounter: Payer: Self-pay | Admitting: *Deleted

## 2022-07-17 ENCOUNTER — Other Ambulatory Visit: Payer: Medicaid Other | Admitting: *Deleted

## 2022-07-17 NOTE — Patient Instructions (Signed)
Visit Information  Curtis Marshall was given information about Medicaid Managed Care team care coordination services as a part of their [facility] Medicaid benefit. Curtis Marshall verbally consented to engagement with the [facility] Managed Care team.   If you are experiencing a medical emergency, please call 911 or report to your local emergency department or urgent care.   If you have a non-emergency medical problem during routine business hours, please contact your provider's office and ask to speak with a nurse.   For questions related to your Healthy [facility] health plan, please call: [phone] or visit the homepage here: [email or web address]  If you would like to schedule transportation through your Healthy [facility] plan, please call the following number at least 2 days in advance of your appointment: [phone]  For information about your ride after you set it up, call Ride Assist at [phone]. Use this number to activate a Will Call pickup, or if your transportation is late for a scheduled pickup. Use this number, too, if you need to make a change or cancel a previously scheduled reservation.  If you need transportation services right away, call [phone]. The after-hours call center is staffed 24 hours to handle ride assistance and urgent reservation requests (including discharges) 365 days a year. Urgent trips include sick visits, hospital discharge requests and life-sustaining treatment.  Call the [place] at [phone], at any time, 24 hours a day, 7 days a week. If you are in danger or need immediate medical attention call 911.  If you would like help to quit smoking, call 1-800-QUIT-NOW 970 591 9935) OR Espaol: 1-855-Djelo-Ya QO:409462) o para ms informacin haga clic aqu or Text READY to 200-400 to register via text  Curtis Marshall,   Please see education materials related to health  maintenance provided by MyChart link.  Patient verbalizes understanding of instructions and care plan provided today and agrees to view in [place]. Active MyChart status and patient understanding of how to access instructions and care plan via MyChart confirmed with patient.     Telephone follow up appointment with Managed Medicaid care management team member scheduled for:09/15/22 @ 3:30pm  Lurena Joiner RN, BSN [place] RN Care Coordinator   Following is a copy of your plan of care:  Care Plan : RN Care Manager Plan of Care  Updates made by Melissa Montane, RN since 07/17/2022 12:00 AM     Problem: Health Management needs related to Improving Cardiovascular Health      Long-Range Goal: Development of Plan of Care to address Health Management needs related to Improving Cardiovascular Health   Start Date: 12/25/2021  Expected End Date: 08/08/2022  Priority: High  Note:   Current Barriers:  Knowledge Deficits related to plan of care for management of Weight Loss Curtis Marshall continues to have pain in his knees, shoulders, arms and neck. He has not been scheduled with [facility] for pain management. He continues to cut down his meal portions and is active at his job daily.  RNCM Clinical Goal(s):  Patient will verbalize understanding of plan for management of Weight Loss as evidenced by patient reports take all medications exactly as prescribed and will call provider for medication related questions as evidenced by patient reports and documentation in EMR    attend all scheduled medical appointments: 07/18/22, 07/22/22 with [facility], 07/28/22 with Dr. Nicki Reaper, 08/07/22 with Cardiology and PCP 09/10/22 as evidenced by provider documentation        through  collaboration with Consulting civil engineer, provider, and care team.   Interventions: Inter-disciplinary care team collaboration (see longitudinal plan of care) Evaluation of current treatment plan related to  self  management and patient's adherence to plan as established by provider Advised patient to contact [employer] to update PCP provider information   Pain Interventions:  (Status:  Goal on track:  Yes.) Long Term Goal Pain assessment performed-knee, shoulders and arm-6-7/10 Medications reviewed Reviewed provider established plan for pain management Discussed importance of adherence to all scheduled medical appointments Counseled on the importance of reporting any/all new or changed pain symptoms or management strategies to pain management provider Discussed use of relaxation techniques and/or diversional activities to assist with pain reduction (distraction, imagery, relaxation, massage, acupressure, TENS, heat, and cold application Assessed social determinant of health barriers Advised patient to reschedule PT and work on provided exercises at home Discussed patient planning to contact [facility] for scheduling an initial appointment   Weight Loss:  (Status: Goal on Track (progressing): YES.) Current weight #301 Advised patient to discuss with primary care provider options regarding weight management;  Provided verbal and/or written education to patient re: provider recommended life style modifications;  Reviewed recommended dietary changes: avoid fad diets, make small/incremental dietary and exercise changes, eat at the table and avoid eating in front of the TV, plan management of cravings, monitor snacking and cravings in food diary; Reviewed enhanced benefits provided by [facility], advised patient to contact member services [phone] Discussed weight loss goals, patient's ultimate goal is to get down to #200, advised patient to start with smaller goals and work towards the larger goal  Reviewed upcoming appointment: see above list   Advised patient to choose healthy snacks or go for a walk when he desires to smoke, examples provided Advised patient to eat 3 small meals a day,  including protein with meals and snacks Advised patient to discuss weight loss goals with PCP, request a referral to Nutrition  Patient Goals/Self-Care Activities: Take medications as prescribed   Attend all scheduled provider appointments Call provider office for new concerns or questions  Work toward #20 weight loss

## 2022-07-17 NOTE — Patient Outreach (Signed)
Medicaid Managed Care   Nurse Care Manager Note  07/17/2022 Name:  Curtis Marshall MRN:  [id number] DOB:  12-Apr-1971  Curtis Marshall is an 52 y.o. year old male who is a primary patient of [place], Rande Brunt, NP.  The [facility] Managed Care Coordination team was consulted for assistance with:    Pain Weight Loss  Mr. Sabatelli was given information about Medicaid Managed Care Coordination team services today. Curtis Marshall Patient agreed to services and verbal consent obtained.  Engaged with patient by telephone for follow up visit in response to provider referral for case management and/or care coordination services.   Assessments/Interventions:  Review of past medical history, allergies, medications, health status, including review of consultants reports, laboratory and other test data, was performed as part of comprehensive evaluation and provision of chronic care management services.  SDOH (Social Determinants of Health) assessments and interventions performed: SDOH Interventions    Flowsheet Row Patient Outreach Telephone from 05/14/2022 in [place] Patient Outreach Telephone from 12/25/2021 in [place] Coordination  SDOH Interventions    Food Insecurity Interventions Intervention Not Indicated Intervention Not Indicated  Housing Interventions Intervention Not Indicated Intervention Not Indicated  Transportation Interventions Intervention Not Indicated Intervention Not Indicated  Utilities Interventions Intervention Not Indicated --  Physical Activity Interventions -- Intervention Not Indicated       Care Plan  Allergies  Allergen Reactions   Pepto-Bismol [Bismuth Subsalicylate] Nausea And Vomiting    Medications Reviewed Today     Reviewed by Melissa Montane, RN (Registered Nurse) on 07/17/22 at 1441  Med List Status: <None>   Medication Order Taking? Sig Documenting Provider Last Dose Status Informant   albuterol (VENTOLIN HFA) 108 (90 Base) MCG/ACT inhaler [id number] Yes Inhale 2 puffs into the lungs every 6 (six) hours as needed for wheezing or shortness of breath. [provider] Taking Active Self  aspirin EC 81 MG tablet [id number] Yes Take 1 tablet (81 mg total) by mouth daily. Swallow whole. Nathrop, Crista Luria, PA Taking Active   atorvastatin (LIPITOR) 40 MG tablet [id number] Yes TAKE ONE (1) TABLET BY MOUTH EVERY DAY Bhagat, Bhavinkumar, PA Taking Active   HYDROcodone-acetaminophen (NORCO/VICODIN) 5-325 MG tablet [id number] Yes Take 1 tablet by mouth 4 (four) times daily as needed. [provider] Taking Active   isosorbide mononitrate (IMDUR) 30 MG 24 hr tablet [id number] Yes TAKE ONE (1) TABLET BY MOUTH EVERY DAY Bhagat, Bhavinkumar, PA Taking Active   lisinopril (ZESTRIL) 20 MG tablet [id number] Yes Take 1 tablet (20 mg total) by mouth daily. Leanor Kail, PA Taking Active   meclizine (ANTIVERT) 25 MG tablet [id number] Yes Take 25 mg by mouth 3 (three) times daily as needed for dizziness. [provider] Taking Active Self  metoprolol succinate (TOPROL-XL) 25 MG 24 hr tablet [id number] Yes TAKE ONE (1) TABLET BY MOUTH EVERY DAY Bhagat, Bhavinkumar, PA Taking Active   nitroGLYCERIN (NITROSTAT) 0.4 MG SL tablet [id number] Yes Place 1 tablet (0.4 mg total) under the tongue every 5 (five) minutes x 3 doses as needed for chest pain. Leanor Kail, PA Taking Active            Med Note (CAMPOS, ROWEENA A   Mon Nov 18, 2021  9:20 AM) PRN  propranolol ER (INDERAL LA) 60 MG 24 hr capsule [id number] Yes Take by mouth as needed. Per patient taking as needed for migraine headaches [provider] Taking Active   rizatriptan (MAXALT) 10 MG tablet [id number]  Yes Take 10 mg by mouth daily as needed for migraine. [provider] Taking Active Self  ticagrelor (BRILINTA) 90 MG TABS tablet [id number] Yes Take 1 tablet (90 mg total) by mouth 2 (two) times daily.  Leanor Kail, PA Taking Active   topiramate (TOPAMAX) 25 MG tablet [id number] Yes at bedtime. [provider] Taking Active             Patient Active Problem List   Diagnosis Date Noted   Unstable angina ([place])    Secondary hypertension 07/23/2016    Conditions to be addressed/monitored per PCP order:   Pain and Weight loss  Care Plan : [place] of Care  Updates made by Melissa Montane, RN since 07/17/2022 12:00 AM     Problem: Health Management needs related to Improving Cardiovascular Health      Long-Range Goal: Development of Plan of Care to address Health Management needs related to Improving Cardiovascular Health   Start Date: 12/25/2021  Expected End Date: 08/08/2022  Priority: High  Note:   Current Barriers:  Knowledge Deficits related to plan of care for management of Weight Loss Mr. Mooneyhan continues to have pain in his knees, shoulders, arms and neck. He has not been scheduled with [facility] for pain management. He continues to cut down his meal portions and is active at his job daily.  RNCM Clinical Goal(s):  Patient will verbalize understanding of plan for management of Weight Loss as evidenced by patient reports take all medications exactly as prescribed and will call provider for medication related questions as evidenced by patient reports and documentation in EMR    attend all scheduled medical appointments: 07/18/22, 07/22/22 with Vincent, 07/28/22 with Dr. Nicki Reaper, 08/07/22 with Cardiology and PCP 09/10/22 as evidenced by provider documentation        through collaboration with RN Care manager, provider, and care team.   Interventions: Inter-disciplinary care team collaboration (see longitudinal plan of care) Evaluation of current treatment plan related to  self management and patient's adherence to plan as established by provider Advised patient to contact [facility] to update PCP provider information   Pain Interventions:   (Status:  Goal on track:  Yes.) Long Term Goal Pain assessment performed-knee, shoulders and arm-6-7/10 Medications reviewed Reviewed provider established plan for pain management Discussed importance of adherence to all scheduled medical appointments Counseled on the importance of reporting any/all new or changed pain symptoms or management strategies to pain management provider Discussed use of relaxation techniques and/or diversional activities to assist with pain reduction (distraction, imagery, relaxation, massage, acupressure, TENS, heat, and cold application Assessed social determinant of health barriers Advised patient to reschedule PT and work on provided exercises at home Discussed patient planning to contact [facility] for scheduling an initial appointment   Weight Loss:  (Status: Goal on Track (progressing): YES.) Current weight #301 Advised patient to discuss with primary care provider options regarding weight management;  Provided verbal and/or written education to patient re: provider recommended life style modifications;  Reviewed recommended dietary changes: avoid fad diets, make small/incremental dietary and exercise changes, eat at the table and avoid eating in front of the TV, plan management of cravings, monitor snacking and cravings in food diary; Reviewed enhanced benefits provided by [facility], advised patient to contact member services [phone] Discussed weight loss goals, patient's ultimate goal is to get down to #200, advised patient to start with smaller goals and work towards the larger goal  Reviewed upcoming appointment:  see above list   Advised patient to choose healthy snacks or go for a walk when he desires to smoke, examples provided Advised patient to eat 3 small meals a day, including protein with meals and snacks Advised patient to discuss weight loss goals with PCP, request a referral to Nutrition  Patient Goals/Self-Care Activities: Take  medications as prescribed   Attend all scheduled provider appointments Call provider office for new concerns or questions  Work toward #20 weight loss        Follow Up:  Patient agrees to Care Plan and Follow-up.  Plan: The Managed Medicaid care management team will reach out to the patient again over the next 60 days.  Date/time of next scheduled RN care management/care coordination outreach:  09/15/22 @ 3:30pm  Lurena Joiner RN, BSN [place] RN Care Coordinator

## 2022-08-05 NOTE — Progress Notes (Unsigned)
Cardiology Clinic Note   Date: 08/07/2022 ID: Curtis Marshall, DOB Nov 04, 1970, MRN [id number]  Primary Cardiologist:  Peter Martinique, MD  Patient Profile    Curtis Marshall is a 52 y.o. male who presents to the clinic today for delayed follow-up.  Past medical history significant for: Nonobstructive CAD. LHC 10/15/2021 (ACS/STEMI): Ostial to mid LAD 20%.  Normal LV function.  Moderately elevated LVEDP.  Etiology could be vasospasm, resolved thrombus or hypertension.  Recommend medical management with DAPT, BP control and smoking cessation. Echo 10/15/2021: EF 55 to 60%.  No RWMA.  Moderate LVH.  Trivial MR.  IVC dilated.  RA pressure 8 mmHg. Hypertension. Hyperlipidemia. Lipid panel 10/16/2021: LDL 74, HDL 30, TG 142, total 132. LPa 10/16/2021: 9.6. Tobacco abuse.   History of Present Illness    Curtis Marshall was first evaluated by Dr. Martinique on 10/15/2021 after he was transferred from [facility] for STEMI.  Initial EKG at [facility] showed ST elevation 2 mm in anterior precordial leads new compared to old EKG.  Patient was brought to the Cath Lab emergently and no culprit lesion was found (outlined above).  Echo showed normal LV function.  Patient was last seen in the office by Dr. Martinique on 11/18/2021.  At the visit prior in June 2023 patient reported new right-sided weakness and intermittent slurred speech since discharge from hospital.  Patient denied headache or blurred vision.  He endorsed sudden onset of severe dizziness with slurred speech and brief syncope for few seconds 1 day after discharge while at work.  CT brain as well as MRI/MRA of the head and neck were normal and he was referred to neurology.  Allergies seem to feel patient's subtle right-sided weakness was due to cervical radiculopathy and MRI of the cervical spine was ordered but does not appear to have been completed.  From a cardiac standpoint patient was stable at his last visit July 2023.  He has not followed up  since.  Today, patient is doing well. 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 Patient denies shortness of breath or dyspnea on exertion. No chest pain, pressure, or tightness. Denies lower extremity edema, orthopnea, or PND. No palpitations. He reports occasional dizziness with position changes. He stays very active with work at [employer] and doing yard work at home. He is also building a deck for his son. He is concerned that he is having memory loss. 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 He states he can forget what he is saying right in the middle of his thought. He also feels that he is unsafe at work because he sometimes finds himself zoning out. He inquires about being written out of work. Discussed that there is no cardiac reason for him to be out of work. Encouraged him to contact his PCP to discuss his concerns. Patient has not smoked since June.        ROS: All other systems reviewed and are otherwise negative except as noted in History of Present Illness.  Studies Reviewed    ECG is not ordered today.      Physical Exam    VS:  BP 124/68   Pulse 84   Ht 6\' 3"  (1.905 m)   Wt (!) 303 lb (137.4 kg)   SpO2 97%   BMI 37.87 kg/m  , BMI Body mass index is 37.87 kg/m.  GEN: Well nourished, well developed, in no acute distress. Neck: No JVD or carotid bruits. Cardiac: RRR. No murmurs. No rubs or gallops.   Respiratory:  Respirations regular and unlabored. Clear to auscultation without rales, wheezing or rhonchi. GI: Soft, nontender, nondistended. Extremities: Radials/DP/PT 2+ and equal bilaterally. No clubbing or cyanosis. No edema.  Skin: Warm and dry, no rash. Neuro: Strength intact.  Assessment & Plan   Nonobstructive CAD.  LHC June 2023 ostial to mid LAD 20%.  Etiology of ACS/STEMI could be vasospasm, resolved thrombus, or hypertension.  It was recommended medical  management with DAPT, BP control and smoking cessation.  Patient denies chest pain, tightness, or pressure. He stays busy with his job at [employer] and doing yard work at home. He is also building a deck for his son. He quit smoking in June.  Patient is congratulated on quitting smoking and encouraged to continue complete cessation.  Continue aspirin, Brilinta, isosorbide, atorvastatin, metoprolol, as needed SL NTG. Hypertension.  BP today 124/68.  Patient denies headaches or vision changes.  Continue lisinopril and metoprolol. Hyperlipidemia.  LDL June 2023 74, not at goal.  It appears patient was supposed to have recheck of lipids after follow-up visit with Dr. Martinique in July but they were never collected.  Patient is instructed to return when he is fasting to have labs drawn.  Continue atorvastatin. Positional dizziness.  Patient reports occasional dizziness with position changes particularly if he bends over with his head down.  He is educated on bending at the knees so his head is not down pointed towards the ground.  He is also instructed to change positions from lying to sitting and sitting to standing slowly.  He verbalizes understanding. Memory loss. Patient is concerned about memory loss. He forgets what he is saying in the middle of his thought and zones out randomly. He questions if he needs to come out of work because he feels unsafe climbing ladders. Explained to patient that there is no cardiac reason to write him out of work.  Encouraged him to discuss his symptoms with his PCP and possibly obtain a referral to neurology for memory testing.  He is agreeable with this plan.  Disposition: Orders in for lipid panel and LFTs. Return in 6 months or sooner as needed.         Signed, [name]. Ainslie Mazurek, DNP, NP-C

## 2022-08-07 ENCOUNTER — Encounter: Payer: Self-pay | Admitting: Student

## 2022-08-07 ENCOUNTER — Ambulatory Visit: Payer: Medicaid Other | Attending: Student | Admitting: Student

## 2022-08-07 VITALS — BP 124/68 | HR 84 | Ht 75.0 in | Wt 303.0 lb

## 2022-08-07 DIAGNOSIS — R42 Dizziness and giddiness: Secondary | ICD-10-CM | POA: Diagnosis not present

## 2022-08-07 DIAGNOSIS — I2510 Atherosclerotic heart disease of native coronary artery without angina pectoris: Secondary | ICD-10-CM | POA: Diagnosis not present

## 2022-08-07 DIAGNOSIS — I10 Essential (primary) hypertension: Secondary | ICD-10-CM | POA: Diagnosis not present

## 2022-08-07 DIAGNOSIS — E785 Hyperlipidemia, unspecified: Secondary | ICD-10-CM | POA: Diagnosis not present

## 2022-08-07 DIAGNOSIS — R413 Other amnesia: Secondary | ICD-10-CM

## 2022-08-07 NOTE — Patient Instructions (Signed)
Medication Instructions:  Your physician recommends that you continue on your current medications as directed. Please refer to the Current Medication list given to you today.  *If you need a refill on your cardiac medications before your next appointment, please call your pharmacy*   Lab Work: NONE If you have labs (blood work) drawn today and your tests are completely normal, you will receive your results only by: [place] (if you have MyChart) OR A paper copy in the mail If you have any lab test that is abnormal or we need to change your treatment, we will call you to review the results.   Testing/Procedures: NONE   Follow-Up: At [facility], you and your health needs are our priority.  As part of our continuing mission to provide you with exceptional heart care, we have created designated Provider Care Teams.  These Care Teams include your primary Cardiologist (physician) and Advanced Practice Providers (APPs -  Physician Assistants and Nurse Practitioners) who all work together to provide you with the care you need, when you need it.  We recommend signing up for the patient portal called "MyChart".  Sign up information is provided on this After Visit Summary.  MyChart is used to connect with patients for Virtual Visits (Telemedicine).  Patients are able to view lab/test results, encounter notes, upcoming appointments, etc.  Non-urgent messages can be sent to your provider as well.   To learn more about what you can do with MyChart, go to [email or web address].    Your next appointment:   6 month(s)  Provider:   Peter Martinique, MD

## 2022-09-15 ENCOUNTER — Encounter: Payer: Self-pay | Admitting: *Deleted

## 2022-09-15 ENCOUNTER — Other Ambulatory Visit: Payer: Medicaid Other | Admitting: *Deleted

## 2022-09-15 NOTE — Patient Outreach (Signed)
   Embedded Care Coordination  Case Closure Note  09/15/2022 Name: [name] MRN: [id number] DOB: 1970/05/20  Curtis Marshall is a 52 y.o. year old male who is a primary care patient of Lysbeth Galas, NP. The Embedded Care Coordination team was consulted for assistance with chronic disease management and care coordination needs related to  Chronic Pain Weight loss  The patient has met all care management goals, agreed to case closure, and has been provided with contact information for the care management team. Appropriate care team members and provider have been notified via electronic communication. The care management team is available to provide care management/care coordination support at any time in the future should needs arise. Further engagement requires referral order ([id number]).   If patient returns call to provider office and is in need of assistance from the embedded care coordination team, please advise that the patient call the [facility] Care Guide at [phone] for assistance.   Estanislado Emms RN, BSN [place]  Managed [facility] RN Care Coordinator [phone]

## 2022-09-16 ENCOUNTER — Telehealth: Payer: Self-pay | Admitting: Cardiology

## 2022-09-16 MED ORDER — TICAGRELOR 90 MG PO TABS: 90.0000 mg | ORAL_TABLET | Freq: Two times a day (BID) | ORAL | 3 refills | Status: DC

## 2022-09-16 MED ORDER — METOPROLOL SUCCINATE ER 25 MG PO TB24: ORAL_TABLET | ORAL | 3 refills | Status: DC

## 2022-09-16 MED ORDER — ISOSORBIDE MONONITRATE ER 30 MG PO TB24: ORAL_TABLET | ORAL | 3 refills | Status: DC

## 2022-09-16 MED ORDER — LISINOPRIL 20 MG PO TABS: 20.0000 mg | ORAL_TABLET | Freq: Every day | ORAL | 3 refills | Status: DC

## 2022-09-16 MED ORDER — ATORVASTATIN CALCIUM 40 MG PO TABS: ORAL_TABLET | ORAL | 3 refills | Status: DC

## 2022-09-16 NOTE — Telephone Encounter (Signed)
Called patient .informed patient  prescriptions requested has been sent to pharmacy - except Nitroglycerin. Patient states she has 2 bottles . Aspirin is over the counter.  Patient verbalized understanding.

## 2022-09-16 NOTE — Telephone Encounter (Signed)
*  STAT* If patient is at the pharmacy, call can be transferred to refill team.   1. Which medications need to be refilled? (please list name of each medication and dose if known) aspirin EC 81 MG tablet   isosorbide mononitrate (IMDUR) 30 MG 24 hr tablet   lisinopril (ZESTRIL) 20 MG tablet   metoprolol succinate (TOPROL-XL) 25 MG 24 hr tablet   nitroGLYCERIN (NITROSTAT) 0.4 MG SL tablet   ticagrelor (BRILINTA) 90 MG TABS tablet   2. Which pharmacy/location (including street and city if local pharmacy) is medication to be sent to?  [facility] [place] [place] - [address]    3. Do they need a 30 day or 90 day supply? 90

## 2023-01-14 NOTE — Progress Notes (Signed)
Cardiology Office Note:    Date:  01/14/2023   ID:  Curtis Marshall, DOB Jan 07, 1971, MRN [id number]  PCP:  Lysbeth Galas, NP  [facility] Cardiologist:  Ferdinand Revoir [place], MD  [facility] Electrophysiologist:  None   Chief Complaint:  follow up CAD  History of Present Illness:    Curtis Marshall is a 52 y.o. male with a hx of hypertension and tobacco abuse seen for follow up CAD.   The patient was initially presented to [facility] for evaluation of chest pain on 10/15/21.  EKG showed ST elevation in anterior leads.  Code STEMI activated and transferred to [facility].  Upon arrival, patient was chest pain-free with normalization of ST segment.  Emergent cardiac cath showed mild nonobstructive disease otherwise no culprit lesion.  Moderately elevated LVEDP.  It was felt the etiology of ACS could be due to vasospasm, resolved thrombus or hypertension.  Recommended medical management with dual antiplatelet therapy.  Troponin peaked at 340. Echocardiogram with preserved LV function.  Added statin and Imdur.  Home propranolol changed to Toprol-XL.  On follow up today he is doing well from a cardiac standpoint. He has no recurrent chest pain or SOB. Was seen by Neurology for right sided weakness. They felt he had cervical radiculopathy. Has been started on metformin by PCP for DM with A1c 7.2%. He did have a kidney stone in July. He is followed by Hematology for hemachromatosis and has periodic phlebotomy.  Past Medical History:  Diagnosis Date   Bronchitis    Hypertension    ST elevation myocardial infarction (STEMI) of anterior wall (HCC) 10/2021   Emergent cath with mild nonobstructive CAD.  Patient had MINOCA.    Past Surgical History:  Procedure Laterality Date   LEFT HEART CATH AND CORONARY ANGIOGRAPHY N/A 10/15/2021   Procedure: LEFT HEART CATH AND CORONARY ANGIOGRAPHY;  Surgeon: [place], Julion Gatt M, MD;  Location: [facility] INVASIVE CV LAB;  Service: Cardiovascular;  Laterality:  N/A;    Current Medications: No outpatient medications have been marked as taking for the 01/20/23 encounter (Appointment) with [place], Normand Damron M, MD.     Allergies:   Pepto-bismol [bismuth subsalicylate]   Social History   Socioeconomic History   Marital status: Single    Spouse name: Not on file   Number of children: Not on file   Years of education: Not on file   Highest education level: Not on file  Occupational History   Occupation: works at [employer]  Tobacco Use   Smoking status: Former    Current packs/day: 1.00    Types: Cigarettes   Smokeless tobacco: Never  Substance and Sexual Activity   Alcohol use: Never   Drug use: Never   Sexual activity: Not on file  Other Topics Concern   Not on file  Social History Narrative   Not on file   Social Determinants of Health   Financial Resource Strain: Patient Declined (06/10/2022)   Received from [facility], [facility]   Overall Financial Resource Strain (CARDIA)    Difficulty of Paying Living Expenses: Patient declined  Food Insecurity: Low Risk  (10/06/2022)   [facility]   Hunger Vital Sign    Worried About Running Out of Food in the Last Year: Never true    Ran Out of Food in the Last Year: Never true  Transportation Needs: No Transportation Needs (10/06/2022)   Received [facility], [facility]   Transportation  In the past 12 months, has lack of reliable transportation kept you from medical appointments, meetings, work or from getting things needed for daily living? : No  Physical Activity: Patient Declined (06/10/2022)   Received from [facility], [facility]   Exercise Vital Sign    Days of Exercise per Week: Patient declined    Minutes of Exercise per Session: Patient declined  Stress: Stress Concern Present (06/10/2022)   Received from [facility], [facility] - Occupational Stress Questionnaire     Feeling of Stress : To some extent  Social Connections: Socially Integrated (06/10/2022)   Received from [facility], [facility]   Social Network    How would you rate your social network (family, work, friends)?: Good participation with social networks     Family History: The patient's family history includes Heart disease in his father.    ROS:   Please see the history of present illness.    All other systems reviewed and are negative.   EKGs/Labs/Other Studies Reviewed:    The following studies were reviewed today:  LEFT HEART CATH AND CORONARY ANGIOGRAPHY 6/6/223 Ost LAD to Mid LAD lesion is 20% stenosed.   The left ventricular systolic function is normal.   LV end diastolic pressure is moderately elevated.   The left ventricular ejection fraction is 55-65% by visual estimate.   Mild nonobstructive CAD. No culprit lesion seen  Moderately elevated LVEDP Normal LV function   Plan: etiology of acute syndrome could be vasospasm, resolved thrombus or HTN. Recommend medical management with DAPT, BP control and smoking cessation. Will check Echo.    Echo 10/15/21 1. Left ventricular ejection fraction, by estimation, is 55 to 60%. The  left ventricle has normal function. The left ventricle has no regional  wall motion abnormalities. There is moderate concentric left ventricular  hypertrophy. Left ventricular  diastolic parameters were normal.   2. Right ventricular systolic function is normal. The right ventricular  size is normal. Tricuspid regurgitation signal is inadequate for assessing  PA pressure.   3. The mitral valve is grossly normal. Trivial mitral valve  regurgitation. No evidence of mitral stenosis.   4. The aortic valve is grossly normal. Aortic valve regurgitation is not  visualized. No aortic stenosis is present.   5. The inferior vena cava is dilated in size with >50% respiratory  variability, suggesting right atrial pressure of 8 mmHg.   EKG  Interpretation Date/Time:  Tuesday January 20 2023 08:53:27 EDT Ventricular Rate:  65 PR Interval:  172 QRS Duration:  108 QT Interval:  398 QTC Calculation: 413 R Axis:   2  Text Interpretation: Normal sinus rhythm Normal ECG When compared with ECG of 16-Oct-2021 06:04, No significant change was found Confirmed by [place], Juliocesar Blasius [phone]) on 01/20/2023 9:01:08 AM    Recent Labs: No results found for requested labs within last 365 days.  Recent Lipid Panel    Component Value Date/Time   CHOL 132 10/16/2021 0228   TRIG 142 10/16/2021 0228   HDL 30 (L) 10/16/2021 0228   CHOLHDL 4.4 10/16/2021 0228   VLDL 28 10/16/2021 0228   LDLCALC 74 10/16/2021 0228   Dated 10/13/22: cholesterol 108, triglycerides 164, HDL 34, LDL 50. CMET normal. CBC normal. A1c 7.2%.   Physical Exam:    VS:  There were no vitals taken for this visit.    Wt Readings from Last 3 Encounters:  08/07/22 (!) 303 lb (137.4 kg)  11/20/21 281  lb (127.5 kg)  11/18/21 281 lb 6.4 oz (127.6 kg)     GEN:  Well nourished, well developed in no acute distress HEENT: Normal NECK: No JVD; No carotid bruits LYMPHATICS: No lymphadenopathy CARDIAC: RRR, no murmurs, rubs, gallops RESPIRATORY:  Clear to auscultation without rales, wheezing or rhonchi  ABDOMEN: Soft, non-tender, non-distended MUSCULOSKELETAL:  No edema; No deformity  SKIN: Warm and dry NEUROLOGIC:  Alert and oriented x 3. Some RUE weakness, difficulty finding words. PSYCHIATRIC:  Normal affect   ASSESSMENT AND PLAN:    Anterior STEMI/ unstable angina. ST elevation resolved by presentation to cath lab.  Cath with mild nonobstructive CAD.  It was felt that ACS could be due to vasospasm, resolved thrombus or hypertension.  Ok to discontinue Brilinta. Continue ASA.  Continue nitrates and beta blocker.   2.  Hypertension -Blood pressure now well controlled.   3.  Cervical radiculopathy with right-sided weakness.   4. Tobacco abuse. Congratulated on  smoking cessation  5. Hyperlipidemia. LDL at goal on statin.  6. DM now on metformin  7. Hemachromatosis   Will arrange follow up in 6 months    Medication Adjustments/Labs and Tests Ordered: Current medicines are reviewed at length with the patient today.  Concerns regarding medicines are outlined above.  No orders of the defined types were placed in this encounter.  No orders of the defined types were placed in this encounter.   There are no Patient Instructions on file for this visit.   Signed, Xylon Croom [place], MD  01/14/2023 7:33 AM    [facility]

## 2023-01-20 ENCOUNTER — Encounter: Payer: Self-pay | Admitting: Cardiology

## 2023-01-20 ENCOUNTER — Ambulatory Visit: Payer: Medicaid Other | Attending: Cardiology | Admitting: Cardiology

## 2023-01-20 VITALS — BP 126/84 | HR 65 | Ht 75.0 in | Wt 314.2 lb

## 2023-01-20 DIAGNOSIS — I25118 Atherosclerotic heart disease of native coronary artery with other forms of angina pectoris: Secondary | ICD-10-CM

## 2023-01-20 DIAGNOSIS — Z720 Tobacco use: Secondary | ICD-10-CM

## 2023-01-20 DIAGNOSIS — I10 Essential (primary) hypertension: Secondary | ICD-10-CM | POA: Diagnosis not present

## 2023-01-20 DIAGNOSIS — E785 Hyperlipidemia, unspecified: Secondary | ICD-10-CM

## 2023-01-20 MED ORDER — METFORMIN HCL ER 500 MG PO TB24: 1000.0000 mg | ORAL_TABLET | Freq: Every day | ORAL | Status: AC

## 2023-01-20 NOTE — Patient Instructions (Signed)
Medication Instructions:  Continue same medications *If you need a refill on your cardiac medications before your next appointment, please call your pharmacy*   Lab Work: None ordered   Testing/Procedures: None ordered   Follow-Up: At [facility], you and your health needs are our priority.  As part of our continuing mission to provide you with exceptional heart care, we have created designated Provider Care Teams.  These Care Teams include your primary Cardiologist (physician) and Advanced Practice Providers (APPs -  Physician Assistants and Nurse Practitioners) who all work together to provide you with the care you need, when you need it.  We recommend signing up for the patient portal called "MyChart".  Sign up information is provided on this After Visit Summary.  MyChart is used to connect with patients for Virtual Visits (Telemedicine).  Patients are able to view lab/test results, encounter notes, upcoming appointments, etc.  Non-urgent messages can be sent to your provider as well.   To learn more about what you can do with MyChart, go to [email or web address].    Your next appointment:  6 months    Call in Jan to schedule March appointment     Provider:  Dr.Jordan's PA

## 2023-06-26 ENCOUNTER — Other Ambulatory Visit: Payer: Self-pay | Admitting: Cardiology

## 2023-07-19 NOTE — Progress Notes (Unsigned)
  Cardiology Office Note:    Date:  07/19/2023  ID:  Curtis Marshall, DOB [birth year], MRN [id number] PCP: Lysbeth Galas, NP  [facility] Providers Cardiologist:  Peter [place], MD { Click to update primary MD,subspecialty MD or APP then REFRESH:1}    {Click to Open Review  :1}   Patient Profile:      Chief Complaint: 24-month follow-up for hypertension, coronary artery disease History of Present Illness:  Curtis Marshall is a 53 y.o. male with visit-pertinent history of hypertension, tobacco abuse, coronary artery disease, hemochromatosis, hyperlipidemia, T2DM  The patient initially presented to [facility] for evaluation of chest pain on 10/15/2021.  EKG showed ST elevation in anterior leads.  Code STEMI was activated and patient was transferred to [facility].  Emergent cardiac cath showed mild nonobstructive disease with ostial to mid LAD 20% stenosis otherwise no culprit lesion.  There was moderately elevated LVEDP.  It was felt the etiology of ACS could be due to vasospasm, resolved thrombus or hypertension.  Recommended medical management with dual antiplatelet therapy.  Troponin peaked at 340.  Echocardiogram was with preserved LV function at 55-60%, no RWMA, moderate LVH, trivial MR.  Statin and Imdur were added to his medication regimen and his home propranolol was changed to Toprol XL.  He was last seen in office on 01/20/2023 by Dr. [place].  He was without any cardiovascular concerns or complaints.  Discussed the use of AI scribe software for clinical note transcription with the patient, who gave verbal consent to proceed.  History of Present Illness            Review of systems:  Please see the history of present illness. All other systems are reviewed and otherwise negative. ***     Home Medications:    No outpatient medications have been marked as taking for the 07/20/23 encounter (Appointment) with Denyce Robert, NP.   Studies Reviewed:       *** Risk  Assessment/Calculations:   {Does this patient have ATRIAL FIBRILLATION?:[phone]} No BP recorded.  {Refresh Note OR Click here to enter BP  :1}***       Physical Exam:   VS:  There were no vitals taken for this visit.   Wt Readings from Last 3 Encounters:  01/20/23 (!) 314 lb 3.2 oz (142.5 kg)  08/07/22 (!) 303 lb (137.4 kg)  11/20/21 281 lb (127.5 kg)    GEN: Well nourished, well developed in no acute distress NECK: No JVD; No carotid bruits CARDIAC: ***RRR, no murmurs, rubs, gallops RESPIRATORY:  Clear to auscultation without rales, wheezing or rhonchi  ABDOMEN: Soft, non-tender, non-distended EXTREMITIES:  No edema; No acute deformity ***     Assessment and Plan:  Assessment and Plan              {Are you ordering a CV Procedure (e.g. stress test, cath, DCCV, TEE, etc)?   Press F2        :[id number]}  Dispo:  No follow-ups on file.  Signed, Denyce Robert, NP

## 2023-07-20 ENCOUNTER — Encounter: Payer: Self-pay | Admitting: Emergency Medicine

## 2023-07-20 ENCOUNTER — Ambulatory Visit: Payer: Medicaid Other | Admitting: Nurse Practitioner

## 2023-07-20 ENCOUNTER — Ambulatory Visit: Payer: Medicaid Other | Attending: Emergency Medicine | Admitting: Emergency Medicine

## 2023-07-20 VITALS — BP 110/76 | HR 76 | Ht 75.0 in | Wt 288.8 lb

## 2023-07-20 DIAGNOSIS — Z720 Tobacco use: Secondary | ICD-10-CM

## 2023-07-20 DIAGNOSIS — I25118 Atherosclerotic heart disease of native coronary artery with other forms of angina pectoris: Secondary | ICD-10-CM | POA: Diagnosis not present

## 2023-07-20 DIAGNOSIS — E785 Hyperlipidemia, unspecified: Secondary | ICD-10-CM

## 2023-07-20 DIAGNOSIS — I10 Essential (primary) hypertension: Secondary | ICD-10-CM

## 2023-07-20 DIAGNOSIS — E1169 Type 2 diabetes mellitus with other specified complication: Secondary | ICD-10-CM

## 2023-07-20 NOTE — Patient Instructions (Signed)
 Medication Instructions:  CONTINUE TAKING YOUR CURRENT MEDICATIONS.    Lab Work: BMET TO BE DONE TODAY.    Testing/Procedures: NONE   Follow-Up: At [facility], you and your health needs are our priority.  As part of our continuing mission to provide you with exceptional heart care, we have created designated Provider Care Teams.  These Care Teams include your primary Cardiologist (physician) and Advanced Practice Providers (APPs -  Physician Assistants and Nurse Practitioners) who all work together to provide you with the care you need, when you need it.   Your next appointment:   6 month(s)  Provider:   Peter [place], MD    Other Instructions:

## 2023-07-21 LAB — BASIC METABOLIC PANEL
BUN/Creatinine Ratio: 14 (ref 9–20)
BUN: 14 mg/dL (ref 6–24)
CO2: 25 mmol/L (ref 20–29)
Calcium: 10 mg/dL (ref 8.7–10.2)
Chloride: 103 mmol/L (ref 96–106)
Creatinine, Ser: 0.99 mg/dL (ref 0.76–1.27)
Glucose: 108 mg/dL — ABNORMAL HIGH (ref 70–99)
Potassium: 4.8 mmol/L (ref 3.5–5.2)
Sodium: 139 mmol/L (ref 134–144)
eGFR: 91 mL/min/{1.73_m2} (ref >59)

## 2023-10-03 ENCOUNTER — Other Ambulatory Visit: Payer: Self-pay | Admitting: Cardiology

## 2024-01-21 ENCOUNTER — Encounter: Payer: Self-pay | Admitting: Cardiology

## 2024-01-26 ENCOUNTER — Other Ambulatory Visit: Payer: Self-pay | Admitting: Cardiology

## 2024-02-29 ENCOUNTER — Other Ambulatory Visit: Payer: Self-pay | Admitting: Cardiology

## 2024-03-08 ENCOUNTER — Encounter: Payer: Self-pay | Admitting: Cardiology

## 2024-03-08 NOTE — Progress Notes (Unsigned)
 Cardiology Office Note   Date:  03/10/2024  ID:  Curtis Marshall, DOB 17-Jul-1970, MRN [id number] PCP: Jesus Elberta Gainer, FNP  [facility] Providers Cardiologist:  Peter Jordan, MD     History of Present Illness Curtis Marshall is a 53 y.o. male with a past medical history mild nonobstructive CAD, DM2, dyslipidemia, prior tobacco abuse, RBBB, hemochromatosis--followed by [facility]  10/15/2021 echo EF 55 to 60%, moderate concentric LVH 10/15/2021 left heart cath mild nonobstructive CAD  In 2023 he presented to [facility] for evaluation of chest pain, EKG revealed ST elevation in anterior leads and code STEMI was activated with troponins elevated at 340>> transferred to [facility] however upon arrival he was chest pain-free with normalization of his ST segment, he underwent left heart cath revealing mild nonobstructive CAD, felt it could be related to vasospasm versus resolved thrombus or hypertension, medical management was recommended.  Most recently was evaluated by Lum Louis NP on 07/20/2023, no changes were made to medications or plan of care it was suggested he increase his physical activity, lipids were at goal and his blood pressure was well-controlled and advised to follow-up in 6 months.  He presents today accompanied by his daughter after an episode of chest pain that occurred last week.  He states he was simply laying on the couch and began having chest discomfort similar to prior episode in 2023.  Pain was dull, nonradiating but lasted approximately 15 minutes, he subsequently took 1 nitroglycerin  and his symptoms resolved.  He has not had any further episodes since then. Mentions he had labs with his PCP following this and something was elevated and he was told to follow up with our office. I can see where his PCP was going to check a troponin but no results are available for review. With is prior cath, he had mild non-obstructive CAD, felt to be  likely a result of vasospasm vs resolution of thrombus.    ROS: Review of Systems  Cardiovascular:  Positive for chest pain (1 episode last week).  Neurological:  Positive for dizziness.  All other systems reviewed and are negative.    Studies Reviewed      Cardiac Studies & Procedures   ______________________________________________________________________________________________ CARDIAC CATHETERIZATION  CARDIAC CATHETERIZATION 10/15/2021  Conclusion   Ost LAD to Mid LAD lesion is 20% stenosed.   The left ventricular systolic function is normal.   LV end diastolic pressure is moderately elevated.   The left ventricular ejection fraction is 55-65% by visual estimate.  Mild nonobstructive CAD. No culprit lesion seen Moderately elevated LVEDP Normal LV function  Plan: etiology of acute syndrome could be vasospasm, resolved thrombus or HTN. Recommend medical management with DAPT, BP control and smoking cessation. Will check Echo.  Findings Coronary Findings Diagnostic  Dominance: Right  Left Main Vessel was injected. Vessel is normal in caliber. Vessel is angiographically normal.  Left Anterior Descending Ost LAD to Mid LAD lesion is 20% stenosed.  Left Circumflex Vessel was injected. Vessel is normal in caliber. Vessel is angiographically normal.  Right Coronary Artery Vessel was injected. Vessel is large. The vessel exhibits minimal luminal irregularities.  Intervention  No interventions have been documented.     ECHOCARDIOGRAM  ECHOCARDIOGRAM COMPLETE 10/15/2021  Narrative ECHOCARDIOGRAM REPORT    Patient Name:   Curtis Marshall Date of Exam: 10/15/2021 Medical Rec #:  [id number]        Height:       75.0 in Accession #:  [id number]       Weight:       278.0 lb Date of Birth:  1971/01/29        BSA:          2.524 m Patient Age:    53 years         BP:           144/88 mmHg Patient Gender: M                HR:           64 bpm. Exam Location:   Inpatient  Procedure: 2D Echo, Color Doppler and Cardiac Doppler  Indications:    CAD Native Vessel i25.10  History:        Patient has no prior history of Echocardiogram examinations. Risk Factors:Hypertension and Current Smoker.  Sonographer:    Damien Senior RDCS Referring Phys: [phone] PETER M JORDAN   Sonographer Comments: Technically difficult due to lung interference IMPRESSIONS   1. Left ventricular ejection fraction, by estimation, is 55 to 60%. The left ventricle has normal function. The left ventricle has no regional wall motion abnormalities. There is moderate concentric left ventricular hypertrophy. Left ventricular diastolic parameters were normal. 2. Right ventricular systolic function is normal. The right ventricular size is normal. Tricuspid regurgitation signal is inadequate for assessing PA pressure. 3. The mitral valve is grossly normal. Trivial mitral valve regurgitation. No evidence of mitral stenosis. 4. The aortic valve is grossly normal. Aortic valve regurgitation is not visualized. No aortic stenosis is present. 5. The inferior vena cava is dilated in size with >50% respiratory variability, suggesting right atrial pressure of 8 mmHg.  FINDINGS Left Ventricle: Left ventricular ejection fraction, by estimation, is 55 to 60%. The left ventricle has normal function. The left ventricle has no regional wall motion abnormalities. The left ventricular internal cavity size was normal in size. There is moderate concentric left ventricular hypertrophy. Left ventricular diastolic parameters were normal.  Right Ventricle: The right ventricular size is normal. No increase in right ventricular wall thickness. Right ventricular systolic function is normal. Tricuspid regurgitation signal is inadequate for assessing PA pressure.  Left Atrium: Left atrial size was normal in size.  Right Atrium: Right atrial size was normal in size.  Pericardium: There is no evidence of  pericardial effusion.  Mitral Valve: The mitral valve is grossly normal. Trivial mitral valve regurgitation. No evidence of mitral valve stenosis.  Tricuspid Valve: The tricuspid valve is grossly normal. Tricuspid valve regurgitation is trivial. No evidence of tricuspid stenosis.  Aortic Valve: The aortic valve is grossly normal. Aortic valve regurgitation is not visualized. No aortic stenosis is present.  Pulmonic Valve: The pulmonic valve was grossly normal. Pulmonic valve regurgitation is not visualized. No evidence of pulmonic stenosis.  Aorta: The aortic root and ascending aorta are structurally normal, with no evidence of dilitation.  Venous: The inferior vena cava is dilated in size with greater than 50% respiratory variability, suggesting right atrial pressure of 8 mmHg.  IAS/Shunts: The atrial septum is grossly normal.   LEFT VENTRICLE PLAX 2D LVIDd:         3.35 cm   Diastology LVIDs:         2.10 cm   LV e' medial:    7.83 cm/s LV PW:         1.60 cm   LV E/e' medial:  9.8 LV IVS:        1.50 cm   LV e' lateral:  10.60 cm/s LVOT diam:     2.30 cm   LV E/e' lateral: 7.2 LV SV:         93 LV SV Index:   37 LVOT Area:     4.15 cm   RIGHT VENTRICLE RV S prime:     11.50 cm/s TAPSE (M-mode): 2.3 cm  LEFT ATRIUM             Index        RIGHT ATRIUM           Index LA diam:        3.50 cm 1.39 cm/m   RA Area:     17.40 cm LA Vol (A2C):   51.1 ml 20.25 ml/m  RA Volume:   43.60 ml  17.27 ml/m LA Vol (A4C):   39.5 ml 15.65 ml/m LA Biplane Vol: 47.3 ml 18.74 ml/m AORTIC VALVE LVOT Vmax:   92.90 cm/s LVOT Vmean:  69.300 cm/s LVOT VTI:    0.224 m  AORTA Ao Root diam: 3.30 cm Ao Asc diam:  3.30 cm  MITRAL VALVE MV Area (PHT): 4.39 cm    SHUNTS MV Decel Time: 173 msec    Systemic VTI:  0.22 m MV E velocity: 76.50 cm/s  Systemic Diam: 2.30 cm MV A velocity: 58.30 cm/s MV E/A ratio:  1.31  Darryle Decent MD Electronically signed by Darryle Decent MD Signature  Date/Time: 10/15/2021/3:52:54 PM    Final          ______________________________________________________________________________________________      Risk Assessment/Calculations           Physical Exam VS:  BP 126/88 (BP Location: Right Arm)   Pulse 82   Ht 6' 3 (1.905 m)   Wt 299 lb 12.8 oz (136 kg)   SpO2 95%   BMI 37.47 kg/m        Wt Readings from Last 3 Encounters:  03/10/24 299 lb 12.8 oz (136 kg)  07/20/23 288 lb 12.8 oz (131 kg)  01/20/23 (!) 314 lb 3.2 oz (142.5 kg)    GEN: Well nourished, well developed in no acute distress NECK: No JVD; No carotid bruits CARDIAC: RRR, no murmurs, rubs, gallops RESPIRATORY:  Clear to auscultation without rales, wheezing or rhonchi  ABDOMEN: Soft, non-tender, non-distended EXTREMITIES:  No edema; No deformity   ASSESSMENT AND PLAN Mild CAD - one episode of chest pain last week alleviated by nitroglycerin . We discussed coronary CTA vs starting CCB -- I do not think the ccta would yield much different results, his cath revealed mild ~ 20% stenosis of his LAD, not disease elsewhere, lipids are excellently controlled.  Likely his symptoms are consistent with coronary vasospasm.  He and his daughter are in favor of trialing CCB to see if this helps. Start amlodipine 2.5 mg daily.  Continue nitroglycerin  as needed, aspirin  81 mg daily, Lipitor 40 mg daily, metoprolol  25 mg daily, Imdur  30 mg daily.  I have advised him to contact our office if the symptoms happen again in the future.   Dyslipidemia -LP(a) normal at 9.6, lipids are excellently controlled with LDL of 48, continue Lipitor 40 mg daily.  Hypertension-on lisinopril  20 mg daily, he has some symptoms of orthostasis and dizziness, encouraged him to increase his water intake, will decrease his lisinopril  to 10 mg daily so we can add amlodipine to hopefully prevent vasospasm per above.  Hemochromatosis-follows with [facility]ology.  DM2-most recent A1c well-controlled at 5.9%  in June 2025.  Followed by PCP.  Dispo: Decrease lisinopril  to 10 mg daily, increase water intake, start amlodipine 2.5 mg daily, follow-up with Dr. Jordan or myself in 6 months.  Signed, Delon JAYSON Hoover, NP

## 2024-03-10 ENCOUNTER — Ambulatory Visit: Attending: Cardiology | Admitting: Cardiology

## 2024-03-10 ENCOUNTER — Encounter: Payer: Self-pay | Admitting: Cardiology

## 2024-03-10 VITALS — BP 126/88 | HR 82 | Ht 75.0 in | Wt 299.8 lb

## 2024-03-10 DIAGNOSIS — E785 Hyperlipidemia, unspecified: Secondary | ICD-10-CM

## 2024-03-10 DIAGNOSIS — I25118 Atherosclerotic heart disease of native coronary artery with other forms of angina pectoris: Secondary | ICD-10-CM

## 2024-03-10 DIAGNOSIS — I10 Essential (primary) hypertension: Secondary | ICD-10-CM | POA: Diagnosis present

## 2024-03-10 DIAGNOSIS — E1169 Type 2 diabetes mellitus with other specified complication: Secondary | ICD-10-CM

## 2024-03-10 MED ORDER — LISINOPRIL 10 MG PO TABS: 10.0000 mg | ORAL_TABLET | Freq: Every day | ORAL | 1 refills | Status: DC

## 2024-03-10 MED ORDER — AMLODIPINE BESYLATE 2.5 MG PO TABS: 2.5000 mg | ORAL_TABLET | Freq: Every day | ORAL | 1 refills | Status: DC

## 2024-03-10 NOTE — Patient Instructions (Signed)
 Thank you for choosing [facility]!     Medication Instructions:  Decrease the Lisinopril  to 10mg . Take one tablet daily.  Start Amlodipine 2.5mg . Take one tablet daily.  Make sure to increase your water intake to 48 ounces a day.   *If you need a refill on your cardiac medications before your next appointment, please call your pharmacy*   Lab Work: No labs were ordered during today's visit.  If you have labs (blood work) drawn today and your tests are completely normal, you will receive your results only by: MyChart Message (if you have MyChart) OR A paper copy in the mail If you have any lab test that is abnormal or we need to change your treatment, we will call you to review the results.   Testing/Procedures: No procedures were ordered during today's visit.   Your next appointment:   6 month(s)   Provider:   Peter Jordan, MD     Follow-Up: At [facility], you and your health needs are our priority.  As part of our continuing mission to provide you with exceptional heart care, we have created designated Provider Care Teams.  These Care Teams include your primary Cardiologist (physician) and Advanced Practice Providers (APPs -  Physician Assistants and Nurse Practitioners) who all work together to provide you with the care you need, when you need it. We recommend signing up for the patient portal called MyChart.  Sign up information is provided on this After Visit Summary.  MyChart is used to connect with patients for Virtual Visits (Telemedicine).  Patients are able to view lab/test results, encounter notes, upcoming appointments, etc.  Non-urgent messages can be sent to your provider as well.   To learn more about what you can do with MyChart, go to [email or web address].

## 2024-06-09 ENCOUNTER — Other Ambulatory Visit: Payer: Self-pay | Admitting: Emergency Medicine

## 2024-06-14 MED ORDER — LISINOPRIL 10 MG PO TABS: 10.0000 mg | ORAL_TABLET | Freq: Every day | ORAL | 2 refills | Status: AC

## 2024-06-14 MED ORDER — AMLODIPINE BESYLATE 2.5 MG PO TABS: 2.5000 mg | ORAL_TABLET | Freq: Every day | ORAL | 2 refills | Status: AC

## 2024-06-14 NOTE — Telephone Encounter (Signed)
 Lipids on 10/21/23
